# Patient Record
Sex: Female | Born: 1970 | Race: White | Hispanic: No | Marital: Married | Smoking: Former smoker
Health system: Southern US, Community
[De-identification: ages and names within clinical notes are randomized; demographics above are authoritative.]

## PROBLEM LIST (undated history)

## (undated) DIAGNOSIS — J302 Other seasonal allergic rhinitis: Secondary | ICD-10-CM

## (undated) DIAGNOSIS — K219 Gastro-esophageal reflux disease without esophagitis: Secondary | ICD-10-CM

## (undated) DIAGNOSIS — M85612 Other cyst of bone, left shoulder: Secondary | ICD-10-CM

## (undated) HISTORY — DX: Other cyst of bone, left shoulder: M85.612

## (undated) HISTORY — DX: Other seasonal allergic rhinitis: J30.2

## (undated) HISTORY — PX: TUBAL LIGATION: SHX77

---

## 2005-05-01 ENCOUNTER — Ambulatory Visit: Payer: Self-pay | Admitting: Internal Medicine

## 2010-03-30 ENCOUNTER — Ambulatory Visit: Payer: Self-pay

## 2014-12-22 ENCOUNTER — Emergency Department
Admit: 2014-12-22 | Disposition: A | Discharge status: Home / Self Care | Payer: Self-pay | Admitting: Emergency Medicine

## 2014-12-22 LAB — CBC
HCT: 41.9 % (ref 35.0–47.0)
HGB: 14.6 g/dL (ref 12.0–16.0)
MCH: 30.6 pg (ref 26.0–34.0)
MCHC: 34.7 g/dL (ref 32.0–36.0)
MCV: 88 fL (ref 80–100)
Platelet: 208 10*3/uL (ref 150–440)
RBC: 4.76 10*6/uL (ref 3.80–5.20)
RDW: 12.5 % (ref 11.5–14.5)
WBC: 6.7 10*3/uL (ref 3.6–11.0)

## 2014-12-22 LAB — URINALYSIS, COMPLETE
Bacteria: NONE SEEN
Bilirubin,UR: NEGATIVE
Glucose,UR: NEGATIVE mg/dL (ref 0–75)
KETONE: NEGATIVE
Leukocyte Esterase: NEGATIVE
Nitrite: NEGATIVE
PH: 6 (ref 4.5–8.0)
Protein: NEGATIVE
SPECIFIC GRAVITY: 1.014 (ref 1.003–1.030)

## 2014-12-22 LAB — BASIC METABOLIC PANEL
ANION GAP: 7 (ref 7–16)
BUN: 13 mg/dL
CREATININE: 1.02 mg/dL — AB
Calcium, Total: 9.2 mg/dL
Chloride: 105 mmol/L
Co2: 26 mmol/L
EGFR (Non-African Amer.): >60
GLUCOSE: 89 mg/dL
Potassium: 3.9 mmol/L
Sodium: 138 mmol/L

## 2014-12-22 LAB — TROPONIN I

## 2016-10-03 ENCOUNTER — Encounter: Payer: Self-pay | Admitting: Certified Nurse Midwife

## 2016-10-03 ENCOUNTER — Ambulatory Visit (INDEPENDENT_AMBULATORY_CARE_PROVIDER_SITE_OTHER): Payer: BLUE CROSS/BLUE SHIELD

## 2016-10-03 ENCOUNTER — Ambulatory Visit (INDEPENDENT_AMBULATORY_CARE_PROVIDER_SITE_OTHER): Payer: BLUE CROSS/BLUE SHIELD | Admitting: Certified Nurse Midwife

## 2016-10-03 VITALS — BP 135/85 | HR 70 | Ht 61.5 in | Wt 172.3 lb

## 2016-10-03 DIAGNOSIS — R102 Pelvic and perineal pain: Secondary | ICD-10-CM

## 2016-10-03 LAB — POCT URINALYSIS DIPSTICK
Bilirubin, UA: NEGATIVE
GLUCOSE UA: NEGATIVE
Ketones, UA: NEGATIVE
Leukocytes, UA: NEGATIVE
Nitrite, UA: NEGATIVE
Protein, UA: NEGATIVE
RBC UA: NEGATIVE
SPEC GRAV UA: 1.015
UROBILINOGEN UA: NEGATIVE
pH, UA: 6

## 2016-10-03 LAB — POCT URINE PREGNANCY: Preg Test, Ur: NEGATIVE

## 2016-10-03 NOTE — Patient Instructions (Signed)
Pelvic Pain, Female Pelvic pain is pain felt below the belly button and between your hips. It can be caused by many different things. It is important to get help right away. This is especially true for severe, sharp, or unusual pain that comes on suddenly.  HOME CARE  Only take medicine as told by your doctor.  Rest as told by your doctor.  Eat a healthy diet, such as fruits, vegetables, and lean meats.  Drink enough fluids to keep your pee (urine) clear or pale yellow, or as told.  Avoid sex (intercourse) if it causes pain.  Apply warm or cold packs to your lower belly (abdomen). Use the type of pack that helps the pain.  Avoid situations that cause you stress.  Keep a journal to track your pain. Write down:  When the pain started.  Where it is located.  If there are things that seem to be related to the pain, such as food or your period.  Follow up with your doctor as told. GET HELP RIGHT AWAY IF:   You have heavy bleeding from the vagina.  You have more pelvic pain.  You feel lightheaded or pass out (faint).  You have chills.  You have pain when you pee or have blood in your pee.  You cannot stop having watery poop (diarrhea).  You cannot stop throwing up (vomiting).  You have a fever or lasting symptoms for more than 3 days.  You have a fever and your symptoms suddenly get worse.  You are being physically or sexually abused.  Your medicine does not help your pain.  You have fluid (discharge) coming from your vagina that is not normal. MAKE SURE YOU:  Understand these instructions.  Will watch your condition.  Will get help if you are not doing well or get worse. This information is not intended to replace advice given to you by your health care provider. Make sure you discuss any questions you have with your health care provider. Document Released: 01/29/2008 Document Revised: 09/02/2014 Document Reviewed: 06/02/2015 Elsevier Interactive Patient  Education  2017 [employer].

## 2016-10-03 NOTE — Progress Notes (Signed)
GYN ENCOUNTER NOTE  Subjective:       Sharon Hickman is a 46 y.o. G58P2002 female is here for gynecologic evaluation of the following issues: pelvic pain.   Jaliyha reports bilateral sharp pelvic pain since Monday, right side > left side. She states, "I have not felt this type of pain since I was nine (9) months pregnant". Reports a faint, constant pain that morphed into a pinching pain (7-8/10 on the pain scale) on Tuesday evening. Today, she reports throbbing pain 2-3/10 on the pain scale. She reports going into work late on Tuesday and managing the pain with OTC Motrin.   Questions if this pain is related to ovulation.   Denies difficulty breathing or respiratory distress, chest pain, abnormal vaginal discharge, unexplained vaginal bleeding, dysuria, and leg pain or swelling.    Gynecologic History  Patient's last menstrual period was 09/10/2016 (approximate).   Contraception: tubal ligation   Last Pap: a while ago. Results were: normal  Last mammogram: 3 years ago. Results were: normal  Obstetric History OB History  Gravida Para Term Preterm AB Living  2 2 2     2   SAB TAB Ectopic Multiple Live Births          2    # Outcome Date GA Lbr Len/2nd Weight Sex Delivery Anes PTL Lv  2 Term 05/27/01 [redacted]w[redacted]d  8 lb 12.8 oz (3.992 kg)  CS-Unspec  N LIV  1 Term 01/08/96 [redacted]w[redacted]d  7 lb 2.1 oz (3.234 kg) F CS-Unspec   LIV      Past Medical History:  Diagnosis Date  . Seasonal allergies     Past Surgical History:  Procedure Laterality Date  . CESAREAN SECTION     X2  . TUBAL LIGATION      No current outpatient prescriptions on file prior to visit.   No current facility-administered medications on file prior to visit.     No Known Allergies  Social History   Social History  . Marital status: Married    Spouse name: N/A  . Number of children: N/A  . Years of education: N/A   Occupational History  . Not on file.   Social History Main Topics  . Smoking status: Light Tobacco  Smoker  . Smokeless tobacco: Never Used     Comment: when stressed  . Alcohol use No  . Drug use: No  . Sexual activity: Yes    Partners: Male     Comment: BTL   Other Topics Concern  . Not on file   Social History Narrative  . No narrative on file    Family History  Problem Relation Age of Onset  . Cancer Mother 66    SKIN CANCER  . Migraines Mother   . Thyroid disease Mother   . Diabetes Father   . Heart failure Father   . Hyperlipidemia Father   . Hypertension Father     The following portions of the patient's history were reviewed and updated as appropriate: allergies, current medications, past family history, past medical history, past social history, past surgical history and problem list.  Review of Systems  Review of Systems - Negative except as noted above History obtained from the patient  Objective:   BP 135/85   Pulse 70   Ht 5' 1.5" (1.562 m)   Wt 172 lb 4.8 oz (78.2 kg)   LMP 09/10/2016 (Approximate)   BMI 32.03 kg/m    CONSTITUTIONAL: Well-developed, well-nourished female in no acute distress.  ABDOMEN: Soft, non distended; Non tender.  No Organomegaly.  PELVIC:  External Genitalia: Normal  Vagina: Normal  Cervix: Normal  Uterus: Normal size, shape,consistency, mobile  Adnexa: Normal  Negative UPT   ULTRASOUND REPORT  Location: [facility] Date of Service: 10/04/15   Indications:Pelvic Pain Findings:  The uterus measures 8.6 x 5.1 x 7.1 cm. Echo texture is homogenous without evidence of focal masses.  The Endometrium measures 8.3 mm.  Right Ovary measures 3.9 x 2.8 x 2 cm. It is normal in appearance. Left Ovary measures 4.3 x 1.9 x 1.4 cm. It is normal appearance. Survey of the adnexa demonstrates no adnexal masses. 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 There is no free fluid in the cul de sac.  Impression: 1. 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 Urine dip, GC/Ch, and  ultrasound ordered. Reviewed US findings with patient.   2. Discussed management options including physician referral, use of pain medication and ovarian suppression.   3. RTC if symptoms worsen or fail to improve.   4. Schedule Annual Exam with Melody Shambley, CNM.    Diona Fanti, CNM

## 2016-10-03 NOTE — Progress Notes (Signed)
Patient ID: Sharon Hickman, female   DOB: 1971-04-22, 46 y.o.   MRN: [id number] Pt states pelvic pain started on Monday am, got worse and worse, went home after work, took medication for pain and was better Tuesday am then by afternoon pt started with a pinching pain. Wednesday the same and today pain more on right side-throbbing. Pt had BTL.

## 2016-11-22 ENCOUNTER — Other Ambulatory Visit: Payer: Self-pay | Admitting: Obstetrics and Gynecology

## 2016-12-11 ENCOUNTER — Encounter: Payer: BLUE CROSS/BLUE SHIELD | Admitting: Obstetrics and Gynecology

## 2016-12-20 ENCOUNTER — Telehealth: Payer: Self-pay | Admitting: Certified Nurse Midwife

## 2016-12-20 DIAGNOSIS — Z1231 Encounter for screening mammogram for malignant neoplasm of breast: Secondary | ICD-10-CM

## 2016-12-20 NOTE — Telephone Encounter (Signed)
Order placed

## 2016-12-20 NOTE — Telephone Encounter (Signed)
Patient called to schedule her mammogram and order is needed

## 2016-12-25 ENCOUNTER — Encounter: Payer: BLUE CROSS/BLUE SHIELD | Admitting: Obstetrics and Gynecology

## 2017-03-07 ENCOUNTER — Encounter: Payer: BLUE CROSS/BLUE SHIELD | Admitting: Obstetrics and Gynecology

## 2017-03-14 ENCOUNTER — Encounter: Payer: BLUE CROSS/BLUE SHIELD | Admitting: Obstetrics and Gynecology

## 2017-03-19 ENCOUNTER — Ambulatory Visit (INDEPENDENT_AMBULATORY_CARE_PROVIDER_SITE_OTHER): Payer: BLUE CROSS/BLUE SHIELD | Admitting: Obstetrics and Gynecology

## 2017-03-19 ENCOUNTER — Other Ambulatory Visit: Payer: Self-pay | Admitting: Obstetrics and Gynecology

## 2017-03-19 ENCOUNTER — Encounter: Payer: Self-pay | Admitting: Obstetrics and Gynecology

## 2017-03-19 VITALS — BP 131/85 | HR 88 | Ht 61.5 in | Wt 161.9 lb

## 2017-03-19 DIAGNOSIS — Z01419 Encounter for gynecological examination (general) (routine) without abnormal findings: Secondary | ICD-10-CM | POA: Diagnosis not present

## 2017-03-19 MED ORDER — CYANOCOBALAMIN 1000 MCG/ML IJ SOLN: 1000.0000 ug | INTRAMUSCULAR | 1 refills | Status: DC

## 2017-03-19 NOTE — Patient Instructions (Signed)
Preventive Care 18-39 Years, Female Preventive care refers to lifestyle choices and visits with your health care provider that can promote health and wellness. What does preventive care include?  A yearly physical exam. This is also called an annual well check.  Dental exams once or twice a year.  Routine eye exams. Ask your health care provider how often you should have your eyes checked.  Personal lifestyle choices, including: ? Daily care of your teeth and gums. ? Regular physical activity. ? Eating a healthy diet. ? Avoiding tobacco and drug use. ? Limiting alcohol use. ? Practicing safe sex. ? Taking vitamin and mineral supplements as recommended by your health care provider. What happens during an annual well check? The services and screenings done by your health care provider during your annual well check will depend on your age, overall health, lifestyle risk factors, and family history of disease. Counseling Your health care provider may ask you questions about your:  Alcohol use.  Tobacco use.  Drug use.  Emotional well-being.  Home and relationship well-being.  Sexual activity.  Eating habits.  Work and work Statistician.  Method of birth control.  Menstrual cycle.  Pregnancy history.  Screening You may have the following tests or measurements:  Height, weight, and BMI.  Diabetes screening. This is done by checking your blood sugar (glucose) after you have not eaten for a while (fasting).  Blood pressure.  Lipid and cholesterol levels. These may be checked every 5 years starting at age 38.  Skin check.  Hepatitis C blood test.  Hepatitis B blood test.  Sexually transmitted disease (STD) testing.  BRCA-related cancer screening. This may be done if you have a family history of breast, ovarian, tubal, or peritoneal cancers.  Pelvic exam and Pap test. This may be done every 3 years starting at age 38. Starting at age 30, this may be done  every 5 years if you have a Pap test in combination with an HPV test.  Discuss your test results, treatment options, and if necessary, the need for more tests with your health care provider. Vaccines Your health care provider may recommend certain vaccines, such as:  Influenza vaccine. This is recommended every year.  Tetanus, diphtheria, and acellular pertussis (Tdap, Td) vaccine. You may need a Td booster every 10 years.  Varicella vaccine. You may need this if you have not been vaccinated.  HPV vaccine. If you are 39 or younger, you may need three doses over 6 months.  Measles, mumps, and rubella (MMR) vaccine. You may need at least one dose of MMR. You may also need a second dose.  Pneumococcal 13-valent conjugate (PCV13) vaccine. You may need this if you have certain conditions and were not previously vaccinated.  Pneumococcal polysaccharide (PPSV23) vaccine. You may need one or two doses if you smoke cigarettes or if you have certain conditions.  Meningococcal vaccine. One dose is recommended if you are age 68-21 years and a first-year college student living in a residence hall, or if you have one of several medical conditions. You may also need additional booster doses.  Hepatitis A vaccine. You may need this if you have certain conditions or if you travel or work in places where you may be exposed to hepatitis A.  Hepatitis B vaccine. You may need this if you have certain conditions or if you travel or work in places where you may be exposed to hepatitis B.  Haemophilus influenzae type b (Hib) vaccine. You may need this  if you have certain risk factors.  Talk to your health care provider about which screenings and vaccines you need and how often you need them. This information is not intended to replace advice given to you by your health care provider. Make sure you discuss any questions you have with your health care provider. Document Released: 10/08/2001 Document Revised:  05/01/2016 Document Reviewed: 06/13/2015 Elsevier Interactive Patient Education  2017 [employer] Inc[employer]  

## 2017-03-19 NOTE — Progress Notes (Signed)
Subjective:   Sharon Hickman is a 46 y.o. G47P2002 Caucasian female here for a routine well-woman exam.  Patient's last menstrual period was 02/19/2017.    Current complaints: painful abdominal cramps for 2 weeks around menses monthly, resolved with HRT pellets placed by [facility] in [place]. Overall feels much better and has also lost 20# PCP: me       Does need routine screening labs  Social History: Sexual: heterosexual Marital Status: married Living situation: with family Occupation: unknown occupation Tobacco/alcohol: no tobacco use Illicit drugs: no history of illicit drug use  The following portions of the patient's history were reviewed and updated as appropriate: allergies, current medications, past family history, past medical history, past social history, past surgical history and problem list.  Past Medical History Past Medical History:  Diagnosis Date  . Seasonal allergies     Past Surgical History Past Surgical History:  Procedure Laterality Date  . CESAREAN SECTION     X2  . TUBAL LIGATION      Gynecologic History [id number]  Patient's last menstrual period was 02/19/2017. Contraception: tubal ligation Last Pap: ?Marland Kitchen Results were: normal Last mammogram: 2016. Results were: normal  Obstetric History OB History  Gravida Para Term Preterm AB Living  2 2 2     2   SAB TAB Ectopic Multiple Live Births          2    # Outcome Date GA Lbr Len/2nd Weight Sex Delivery Anes PTL Lv  2 Term 05/27/01 [redacted]w[redacted]d  8 lb 12.8 oz (3.992 kg)  CS-Unspec  N LIV  1 Term 01/08/96 [redacted]w[redacted]d  7 lb 2.1 oz (3.234 kg) F CS-Unspec   LIV      Current Medications Current Outpatient Prescriptions on File Prior to Visit  Medication Sig Dispense Refill  . guaiFENesin (MUCINEX) 600 MG 12 hr tablet Take by mouth 2 (two) times daily.    . phentermine (ADIPEX-P) 37.5 MG tablet Take by mouth.     No current facility-administered medications on file prior to visit.     Review of Systems Patient  denies any headaches, blurred vision, shortness of breath, chest pain, abdominal pain, problems with bowel movements, urination, or intercourse.  Objective:  BP 131/85   Pulse 88   Ht 5' 1.5" (1.562 m)   Wt 161 lb 14.4 oz (73.4 kg)   LMP 02/19/2017   BMI 30.10 kg/m  Physical Exam  General:  Well developed, well nourished, no acute distress. She is alert and oriented x3. Skin:  Warm and dry Neck:  Midline trachea, no thyromegaly or nodules Cardiovascular: Regular rate and rhythm, no murmur heard Lungs:  Effort normal, all lung fields clear to auscultation bilaterally Breasts:  No dominant palpable mass, retraction, or nipple discharge Abdomen:  Soft, non tender, no hepatosplenomegaly or masses Pelvic:  External genitalia is normal in appearance.  The vagina is normal in appearance. The cervix is bulbous, no CMT.  Thin prep pap is done with HR HPV cotesting. Uterus is felt to be normal size, shape, and contour.  No adnexal masses or tenderness noted  Extremities:  No swelling or varicosities noted Psych:  She has a normal mood and affect  Assessment:   Healthy well-woman exam HRT   Plan:  B12 prescription sent in-will have coworker give them to her. F/U 1 year for AE, or sooner if needed Mammogram ordered  Alazia Crocket Rockney Ghee, CNM

## 2017-03-20 ENCOUNTER — Other Ambulatory Visit: Payer: Self-pay | Admitting: Obstetrics and Gynecology

## 2017-03-20 DIAGNOSIS — M85612 Other cyst of bone, left shoulder: Secondary | ICD-10-CM

## 2017-03-20 HISTORY — DX: Other cyst of bone, left shoulder: M85.612

## 2017-03-21 LAB — CYTOLOGY - PAP

## 2017-03-25 ENCOUNTER — Telehealth: Payer: Self-pay | Admitting: *Deleted

## 2017-03-25 NOTE — Telephone Encounter (Signed)
-----   Message from Joylene Igo, [place] sent at 03/25/2017  1:16 PM EDT ----- Pap and HPV are both negative

## 2017-03-25 NOTE — Telephone Encounter (Signed)
Mailed labs to pt

## 2017-04-02 ENCOUNTER — Encounter: Payer: Self-pay | Admitting: General Surgery

## 2017-04-02 ENCOUNTER — Ambulatory Visit (INDEPENDENT_AMBULATORY_CARE_PROVIDER_SITE_OTHER): Payer: BLUE CROSS/BLUE SHIELD | Admitting: General Surgery

## 2017-04-02 VITALS — BP 144/91 | HR 77 | Temp 98.1°F | Ht 61.5 in | Wt 160.0 lb

## 2017-04-02 DIAGNOSIS — D171 Benign lipomatous neoplasm of skin and subcutaneous tissue of trunk: Secondary | ICD-10-CM

## 2017-04-03 DIAGNOSIS — D171 Benign lipomatous neoplasm of skin and subcutaneous tissue of trunk: Secondary | ICD-10-CM | POA: Insufficient documentation

## 2017-04-03 NOTE — Patient Instructions (Signed)
Please look at your blue sheet in case you have questions about your surgery on 04/17/2017 at [facility].

## 2017-04-03 NOTE — Progress Notes (Signed)
Patient ID: Sharon Hickman, female   DOB: 08/02/1971, 45 y.o.   MRN: [id number]  CC: Soft tissue mass of the left clavicle  HPI Sharon Hickman is a 45 y.o. female presents to clinic today for evaluation of a soft tissue mass on the medial aspect of the left clavicle. Patient reports is been there for many years and has gradually increased in size over the last several years. It is not painful. It has never been red or infected and has never had drainage. It is always been soft and freely mobile. Given its visibility and proximity to her bra strap it does sometimes become irritated causing herdiscomfort. She denies any fevers, chills, nausea, vomiting, chest pain, shortness breath, diarrhea, constipation. She is otherwise in her usual state of health.  HPI  Past Medical History:  Diagnosis Date  . Cyst of left clavicle 03/20/2017  . Seasonal allergies     Past Surgical History:  Procedure Laterality Date  . CESAREAN SECTION     X2  . TUBAL LIGATION      Family History  Problem Relation Age of Onset  . Cancer Mother 73       SKIN CANCER  . Migraines Mother   . Thyroid disease Mother   . Diabetes Father   . Heart failure Father   . Hyperlipidemia Father   . Hypertension Father     Social History Social History  Substance Use Topics  . Smoking status: Light Tobacco Smoker  . Smokeless tobacco: Never Used     Comment: when stressed  . Alcohol use 0.6 oz/week    1 Glasses of wine per week     Comment: rarely    No Known Allergies  Current Outpatient Prescriptions  Medication Sig Dispense Refill  . cyanocobalamin (,VITAMIN B-12,) 1000 MCG/ML injection Inject 1 mL (1,000 mcg total) into the muscle every 30 (thirty) days. 10 mL 1   No current facility-administered medications for this visit.      Review of Systems A Multi-point review of systems was asked and was negative except for the findings documented in the history of present illness  Physical Exam Blood pressure (!)  144/91, pulse 77, temperature 98.1 F (36.7 C), temperature source Oral, height 5' 1.5" (1.562 m), weight 72.6 kg (160 lb). CONSTITUTIONAL: No acute distress. EYES: Pupils are equal, round, and reactive to light, Sclera are non-icteric. EARS, NOSE, MOUTH AND THROAT: The oropharynx is clear. The oral mucosa is pink and moist. Hearing is intact to voice. LYMPH NODES:  Lymph nodes in the neck are normal. RESPIRATORY:  Lungs are clear. There is normal respiratory effort, with equal breath sounds bilaterally, and without pathologic use of accessory muscles. CARDIOVASCULAR: Heart is regular without murmurs, gallops, or rubs. GI: The abdomen is soft, nontender, and nondistended. There are no palpable masses. There is no hepatosplenomegaly. There are normal bowel sounds in all quadrants. GU: Rectal deferred.   MUSCULOSKELETAL: Normal muscle strength and tone. No cyanosis or edema.   SKIN: Turgor is good. A 4 cm diameter soft tissue mass is present on the medial aspect of the left clavicle. It is soft and freely mobile consistent with a lipoma. NEUROLOGIC: Motor and sensation is grossly normal. Cranial nerves are grossly intact. PSYCH:  Oriented to person, place and time. Affect is normal.  Data Reviewed No images labs to review for this. I have personally reviewed the patient's imaging, laboratory findings and medical records.    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    Plan    45-year-old female with a soft tissue mass to the left clavicle that is been increasing in size. Discussed the likelihood of this being something malignant is very low however given its increasing size and discomfort is causing discussed that if resection is warranted. Discussed the procedure for local resection in the operating room in detail to include the risks, benefits, alternatives. Patient voiced understanding and desires to proceed. We will plan to proceed to surgery on  Thursday, August 23.     Time spent with the patient was 30 minutes, with more than 50% of the time spent in face-to-face education, counseling and care coordination.     Orlan Aversa, MD FACS General Surgeon 04/03/2017, 7:43 AM    

## 2017-04-04 ENCOUNTER — Telehealth: Payer: Self-pay | Admitting: General Surgery

## 2017-04-04 NOTE — Telephone Encounter (Signed)
I have called patient and left a message. Please advise patient of the information below.   Pt advised of pre op date/time and sx date. Sx: 04/17/17 with Dr Dionne Bucy biopsy of soft tissue mass-left clavicle. Pre op: 04/10/17 between 9-1:00--phone  Patient made aware to call [phone], between 1-3:00pm the day before surgery, to find out what time to arrive.     Patient is responsible for 231.39 for physician charges.

## 2017-04-07 NOTE — Telephone Encounter (Signed)
I have contacted patient and surgery information was given. Patient will pay the physician estimate prior to surgery. No other questions.

## 2017-04-10 ENCOUNTER — Encounter
Admission: RE | Admit: 2017-04-10 | Discharge: 2017-04-10 | Disposition: A | Discharge status: Home / Self Care | Payer: BLUE CROSS/BLUE SHIELD | Source: Ambulatory Visit | Attending: General Surgery | Admitting: General Surgery

## 2017-04-10 HISTORY — DX: Gastro-esophageal reflux disease without esophagitis: K21.9

## 2017-04-10 NOTE — Patient Instructions (Signed)
  Your procedure is scheduled on: 04-17-17 Report to Same Day Surgery 2nd floor medical mall [facility] Entrance-take elevator on left to 2nd floor.  Check in with surgery information desk.) To find out your arrival time please call [phone] between 1PM - 3PM on 04-16-17  Remember: Instructions that are not followed completely may result in serious medical risk, up to and including death, or upon the discretion of your surgeon and anesthesiologist your surgery may need to be rescheduled.    _x___ 1. Do not eat food or drink liquids after midnight. No gum chewing or hard candies.     __x__ 2. No Alcohol for 24 hours before or after surgery.   __x__3. No Smoking for 24 prior to surgery.   ____  4. Bring all medications with you on the day of surgery if instructed.    __x__ 5. Notify your doctor if there is any change in your medical condition     (cold, fever, infections).     Do not wear jewelry, make-up, hairpins, clips or nail polish.  Do not wear lotions, powders, or perfumes. You may wear deodorant.  Do not shave 48 hours prior to surgery. Men may shave face and neck.  Do not bring valuables to the hospital.    [facility] is not responsible for any belongings or valuables.               Contacts, dentures or bridgework may not be worn into surgery.  Leave your suitcase in the car. After surgery it may be brought to your room.  For patients admitted to the hospital, discharge time is determined by your  treatment team.   Patients discharged the day of surgery will not be allowed to drive home.  You will need someone to drive you home and stay with you the night of your procedure.    Please read over the following fact sheets that you were given:     _x___ Take anti-hypertensive (unless it includes a diuretic), cardiac, seizure, asthma,     anti-reflux and psychiatric medicines. These include:  1. PRILOSEC  2. TAKE A PRILOSEC Wednesday NIGHT BEFORE  SURGERY  3.  4.  5.  6.  ____Fleets enema or Magnesium Citrate as directed.   ____ Use CHG Soap or sage wipes as directed on instruction sheet   ____ Use inhalers on the day of surgery and bring to hospital day of surgery  ____ Stop Metformin and Janumet 2 days prior to surgery.    ____ Take 1/2 of usual insulin dose the night before surgery and none on the morning     surgery.   ____ Follow recommendations from Cardiologist, Pulmonologist or PCP regarding stopping Aspirin, Coumadin, Pllavix ,Eliquis, Effient, or Pradaxa, and Pletal.  X____Stop Anti-inflammatories such as Advil, Aleve, Ibuprofen, Motrin, Naproxen, Naprosyn, Goodies powders or aspirin products NOW-OK to take Tylenol .   ____ Stop supplements until after surgery.     ____ Bring C-Pap to the hospital.

## 2017-04-16 MED ORDER — CEFAZOLIN SODIUM-DEXTROSE 2-4 GM/100ML-% IV SOLN
2.0000 g | INTRAVENOUS | Status: AC
  Administered 2017-04-17: 2 g via INTRAVENOUS

## 2017-04-17 ENCOUNTER — Encounter: Payer: Self-pay | Admitting: Anesthesiology

## 2017-04-17 ENCOUNTER — Ambulatory Visit: Payer: BLUE CROSS/BLUE SHIELD | Admitting: Anesthesiology

## 2017-04-17 ENCOUNTER — Encounter
Admission: RE | Disposition: A | Discharge status: Home / Self Care | Payer: Self-pay | Source: Ambulatory Visit | Attending: General Surgery

## 2017-04-17 ENCOUNTER — Ambulatory Visit
Admission: RE | Admit: 2017-04-17 | Discharge: 2017-04-17 | Disposition: A | Discharge status: Home / Self Care | Payer: BLUE CROSS/BLUE SHIELD | Source: Ambulatory Visit | Attending: General Surgery | Admitting: General Surgery

## 2017-04-17 DIAGNOSIS — Z8349 Family history of other endocrine, nutritional and metabolic diseases: Secondary | ICD-10-CM | POA: Insufficient documentation

## 2017-04-17 DIAGNOSIS — D1722 Benign lipomatous neoplasm of skin and subcutaneous tissue of left arm: Secondary | ICD-10-CM

## 2017-04-17 DIAGNOSIS — F17200 Nicotine dependence, unspecified, uncomplicated: Secondary | ICD-10-CM | POA: Insufficient documentation

## 2017-04-17 DIAGNOSIS — Z820 Family history of epilepsy and other diseases of the nervous system: Secondary | ICD-10-CM | POA: Insufficient documentation

## 2017-04-17 DIAGNOSIS — D171 Benign lipomatous neoplasm of skin and subcutaneous tissue of trunk: Secondary | ICD-10-CM | POA: Diagnosis not present

## 2017-04-17 DIAGNOSIS — Z8249 Family history of ischemic heart disease and other diseases of the circulatory system: Secondary | ICD-10-CM | POA: Insufficient documentation

## 2017-04-17 DIAGNOSIS — Z9889 Other specified postprocedural states: Secondary | ICD-10-CM | POA: Diagnosis not present

## 2017-04-17 DIAGNOSIS — Z808 Family history of malignant neoplasm of other organs or systems: Secondary | ICD-10-CM | POA: Insufficient documentation

## 2017-04-17 DIAGNOSIS — Z833 Family history of diabetes mellitus: Secondary | ICD-10-CM | POA: Diagnosis not present

## 2017-04-17 DIAGNOSIS — Z79899 Other long term (current) drug therapy: Secondary | ICD-10-CM | POA: Diagnosis not present

## 2017-04-17 HISTORY — PX: LIPOMA EXCISION: SHX5283

## 2017-04-17 LAB — POCT PREGNANCY, URINE: Preg Test, Ur: NEGATIVE

## 2017-04-17 SURGERY — EXCISION LIPOMA
Anesthesia: General | Site: Chest | Laterality: Left | Wound class: Clean

## 2017-04-17 MED ORDER — DEXAMETHASONE SODIUM PHOSPHATE 10 MG/ML IJ SOLN
INTRAMUSCULAR | Status: AC
Start: 2017-04-17 — End: 2017-04-17
  Filled 2017-04-17: qty 1

## 2017-04-17 MED ORDER — CHLORHEXIDINE GLUCONATE CLOTH 2 % EX PADS: 6.0000 | MEDICATED_PAD | Freq: Once | CUTANEOUS | Status: DC

## 2017-04-17 MED ORDER — GLYCOPYRROLATE 0.2 MG/ML IJ SOLN
INTRAMUSCULAR | Status: DC | PRN
Start: 2017-04-17 — End: 2017-04-17
  Administered 2017-04-17: 0.2 mg via INTRAVENOUS

## 2017-04-17 MED ORDER — FENTANYL CITRATE (PF) 100 MCG/2ML IJ SOLN
INTRAMUSCULAR | Status: DC | PRN
  Administered 2017-04-17: 50 ug via INTRAVENOUS

## 2017-04-17 MED ORDER — PROPOFOL 10 MG/ML IV BOLUS
INTRAVENOUS | Status: AC
  Filled 2017-04-17: qty 20

## 2017-04-17 MED ORDER — BUPIVACAINE HCL (PF) 0.5 % IJ SOLN
INTRAMUSCULAR | Status: DC | PRN
  Administered 2017-04-17: 4 mL

## 2017-04-17 MED ORDER — MIDAZOLAM HCL 2 MG/2ML IJ SOLN
INTRAMUSCULAR | Status: AC
  Filled 2017-04-17: qty 2

## 2017-04-17 MED ORDER — ONDANSETRON HCL 4 MG/2ML IJ SOLN
INTRAMUSCULAR | Status: AC
  Filled 2017-04-17: qty 2

## 2017-04-17 MED ORDER — LIDOCAINE HCL (PF) 1 % IJ SOLN
INTRAMUSCULAR | Status: DC | PRN
  Administered 2017-04-17: 4 mL

## 2017-04-17 MED ORDER — ONDANSETRON HCL 4 MG/2ML IJ SOLN
INTRAMUSCULAR | Status: DC | PRN
  Administered 2017-04-17: 4 mg via INTRAVENOUS

## 2017-04-17 MED ORDER — FENTANYL CITRATE (PF) 100 MCG/2ML IJ SOLN
INTRAMUSCULAR | Status: AC
  Filled 2017-04-17: qty 2

## 2017-04-17 MED ORDER — MIDAZOLAM HCL 2 MG/2ML IJ SOLN
INTRAMUSCULAR | Status: DC | PRN
  Administered 2017-04-17: 2 mg via INTRAVENOUS

## 2017-04-17 MED ORDER — LIDOCAINE HCL (PF) 2 % IJ SOLN
INTRAMUSCULAR | Status: AC
  Filled 2017-04-17: qty 2

## 2017-04-17 MED ORDER — ONDANSETRON HCL 4 MG/2ML IJ SOLN: 4.0000 mg | Freq: Once | INTRAMUSCULAR | Status: DC | PRN

## 2017-04-17 MED ORDER — FENTANYL CITRATE (PF) 100 MCG/2ML IJ SOLN: 25.0000 ug | INTRAMUSCULAR | Status: DC | PRN

## 2017-04-17 MED ORDER — LIDOCAINE HCL (CARDIAC) 20 MG/ML IV SOLN
INTRAVENOUS | Status: DC | PRN
  Administered 2017-04-17: 80 mg via INTRAVENOUS

## 2017-04-17 MED ORDER — DEXAMETHASONE SODIUM PHOSPHATE 10 MG/ML IJ SOLN
INTRAMUSCULAR | Status: DC | PRN
  Administered 2017-04-17: 4 mg via INTRAVENOUS

## 2017-04-17 MED ORDER — PROPOFOL 10 MG/ML IV BOLUS
INTRAVENOUS | Status: DC | PRN
  Administered 2017-04-17: 150 mg via INTRAVENOUS

## 2017-04-17 MED ORDER — LACTATED RINGERS IV SOLN
INTRAVENOUS | Status: DC
  Administered 2017-04-17: 50 mL/h via INTRAVENOUS

## 2017-04-17 SURGICAL SUPPLY — 31 items
BLADE SURG 15 STRL LF DISP TIS (BLADE) ×1 IMPLANT
BLADE SURG 15 STRL SS (BLADE) ×2
CHLORAPREP W/TINT 26ML (MISCELLANEOUS) ×3 IMPLANT
DERMABOND ADVANCED (GAUZE/BANDAGES/DRESSINGS) ×2
DERMABOND ADVANCED .7 DNX12 (GAUZE/BANDAGES/DRESSINGS) ×1 IMPLANT
DRAPE LAPAROTOMY 100X77 ABD (DRAPES) ×3 IMPLANT
ELECT CAUTERY NEEDLE TIP 1.0 (MISCELLANEOUS) ×3
ELECT REM PT RETURN 9FT ADLT (ELECTROSURGICAL) ×3
ELECTRODE CAUTERY NEDL TIP 1.0 (MISCELLANEOUS) ×1 IMPLANT
ELECTRODE REM PT RTRN 9FT ADLT (ELECTROSURGICAL) ×1 IMPLANT
GLOVE BIO SURGEON STRL SZ7.5 (GLOVE) ×3 IMPLANT
GLOVE INDICATOR 8.0 STRL GRN (GLOVE) ×3 IMPLANT
GOWN STRL REUS W/ TWL LRG LVL3 (GOWN DISPOSABLE) ×2 IMPLANT
GOWN STRL REUS W/TWL LRG LVL3 (GOWN DISPOSABLE) ×4
JACKSON PRATT 7MM (INSTRUMENTS) IMPLANT
KIT RM TURNOVER STRD PROC AR (KITS) ×3 IMPLANT
LABEL OR SOLS (LABEL) IMPLANT
NDL SAFETY 25GX1.5 (NEEDLE) ×3 IMPLANT
NS IRRIG 500ML POUR BTL (IV SOLUTION) ×3 IMPLANT
PACK BASIN MINOR ARMC (MISCELLANEOUS) ×3 IMPLANT
SPONGE DRAIN TRACH 4X4 STRL 2S (GAUZE/BANDAGES/DRESSINGS) IMPLANT
SUT ETHILON 3-0 FS-10 30 BLK (SUTURE)
SUT MNCRL 4-0 (SUTURE) ×2
SUT MNCRL 4-0 27XMFL (SUTURE) ×1
SUT VIC AB 2-0 CT2 27 (SUTURE) IMPLANT
SUT VIC AB 3-0 SH 27 (SUTURE) ×2
SUT VIC AB 3-0 SH 27X BRD (SUTURE) ×1 IMPLANT
SUTURE EHLN 3-0 FS-10 30 BLK (SUTURE) IMPLANT
SUTURE MNCRL 4-0 27XMF (SUTURE) ×1 IMPLANT
SYR BULB EAR ULCER 3OZ GRN STR (SYRINGE) ×3 IMPLANT
SYRINGE 10CC LL (SYRINGE) ×3 IMPLANT

## 2017-04-17 NOTE — OR Nursing (Signed)
Patient complains of runny nose for last few days.  Denies fever, chills or any other symptoms.

## 2017-04-17 NOTE — Transfer of Care (Signed)
Immediate Anesthesia Transfer of Care Note  Patient: Sharon Hickman  Procedure(s) Performed: Procedure(s): EXCISION LIPOMA-LEFT CLAVICLE (Left)  Patient Location: PACU  Anesthesia Type:General  Level of Consciousness: sedated  Airway & Oxygen Therapy: Patient Spontanous Breathing and Patient connected to face mask oxygen  Post-op Assessment: Report given to RN and Post -op Vital signs reviewed and stable  Post vital signs: Reviewed and stable  Last Vitals:  Vitals:   04/17/17 1025 04/17/17 1026  BP: (!) (P) 91/58 (!) 91/58  Pulse:  (!) 44  Resp:  13  Temp: (!) (P) 36.2 C   SpO2:  94%    Last Pain:  Vitals:   04/17/17 1025  TempSrc:   PainSc: (P) Asleep      Patients Stated Pain Goal: 0 (09/31/12 1624)  Complications: No apparent anesthesia complications

## 2017-04-17 NOTE — H&P (View-Only) (Signed)
Patient ID: Sharon Hickman, female   DOB: [birth year], 46 y.o.   MRN: [id number]  CC: Soft tissue mass of the left clavicle  HPI Sharon Hickman is a 46 y.o. female presents to clinic today for evaluation of a soft tissue mass on the medial aspect of the left clavicle. Patient reports is been there for many years and has gradually increased in size over the last several years. It is not painful. It has never been red or infected and has never had drainage. It is always been soft and freely mobile. Given its visibility and proximity to her bra strap it does sometimes become irritated causing herdiscomfort. She denies any fevers, chills, nausea, vomiting, chest pain, shortness breath, diarrhea, constipation. She is otherwise in her usual state of health.  HPI  Past Medical History:  Diagnosis Date  . Cyst of left clavicle 03/20/2017  . Seasonal allergies     Past Surgical History:  Procedure Laterality Date  . CESAREAN SECTION     X2  . TUBAL LIGATION      Family History  Problem Relation Age of Onset  . Cancer Mother 52       SKIN CANCER  . Migraines Mother   . Thyroid disease Mother   . Diabetes Father   . Heart failure Father   . Hyperlipidemia Father   . Hypertension Father     Social History Social History  Substance Use Topics  . Smoking status: Light Tobacco Smoker  . Smokeless tobacco: Never Used     Comment: when stressed  . Alcohol use 0.6 oz/week    1 Glasses of wine per week     Comment: rarely    No Known Allergies  Current Outpatient Prescriptions  Medication Sig Dispense Refill  . cyanocobalamin (,VITAMIN B-12,) 1000 MCG/ML injection Inject 1 mL (1,000 mcg total) into the muscle every 30 (thirty) days. 10 mL 1   No current facility-administered medications for this visit.      Review of Systems A Multi-point review of systems was asked and was negative except for the findings documented in the history of present illness  Physical Exam Blood pressure (!)  144/91, pulse 77, temperature 98.1 F (36.7 C), temperature source Oral, height 5' 1.5" (1.562 m), weight 72.6 kg (160 lb). CONSTITUTIONAL: No acute distress. EYES: Pupils are equal, round, and reactive to light, Sclera are non-icteric. EARS, NOSE, MOUTH AND THROAT: The oropharynx is clear. The oral mucosa is pink and moist. Hearing is intact to voice. LYMPH NODES:  Lymph nodes in the neck are normal. RESPIRATORY:  Lungs are clear. There is normal respiratory effort, with equal breath sounds bilaterally, and without pathologic use of accessory muscles. CARDIOVASCULAR: Heart is regular without murmurs, gallops, or rubs. GI: The abdomen is soft, nontender, and nondistended. There are no palpable masses. There is no hepatosplenomegaly. There are normal bowel sounds in all quadrants. GU: Rectal deferred.   MUSCULOSKELETAL: Normal muscle strength and tone. No cyanosis or edema.   SKIN: Turgor is good. A 4 cm diameter soft tissue mass is present on the medial aspect of the left clavicle. It is soft and freely mobile consistent with a lipoma. NEUROLOGIC: Motor and sensation is grossly normal. Cranial nerves are grossly intact. PSYCH:  Oriented to person, place and time. Affect is normal.  Data Reviewed No images labs to review for this. I have personally reviewed the patient's imaging, laboratory findings and medical records.    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    Plan    46 year old female with a soft tissue mass to the left clavicle that is been increasing in size. Discussed the likelihood of this being something malignant is very low however given its increasing size and discomfort is causing discussed that if resection is warranted. Discussed the procedure for local resection in the operating room in detail to include the risks, benefits, alternatives. Patient voiced understanding and desires to proceed. We will plan to proceed to surgery on  Thursday, August 23.     Time spent with the patient was 30 minutes, with more than 50% of the time spent in face-to-face education, counseling and care coordination.     Clayburn Pert, MD FACS General Surgeon 04/03/2017, 7:43 AM

## 2017-04-17 NOTE — Interval H&P Note (Signed)
History and Physical Interval Note:  04/17/2017 9:29 AM  Sharon Hickman  has presented today for surgery, with the diagnosis of lipoma of chest wall -left  The various methods of treatment have been discussed with the patient and family. After consideration of risks, benefits and other options for treatment, the patient has consented to  Procedure(s): EXCISION LIPOMA-LEFT CLAVICLE (Left) as a surgical intervention .  The patient's history has been reviewed, patient examined, no change in status, stable for surgery.  I have reviewed the patient's chart and labs.  Questions were answered to the patient's satisfaction.     Clayburn Pert

## 2017-04-17 NOTE — Brief Op Note (Signed)
04/17/2017  10:22 AM  PATIENT:  Sharon Hickman  46 y.o. female  PRE-OPERATIVE DIAGNOSIS:  lipoma of chest wall -left  POST-OPERATIVE DIAGNOSIS:  lipoma of chest wall -left  PROCEDURE:  Procedure(s): EXCISION LIPOMA-LEFT CLAVICLE (Left)  SURGEON:  Surgeon(s) and Role:    Clayburn Pert, MD - Primary  PHYSICIAN ASSISTANT:   ASSISTANTS: none   ANESTHESIA:   general  EBL:  No intake/output data recorded.  BLOOD ADMINISTERED:none  DRAINS: none   LOCAL MEDICATIONS USED:  MARCAINE   , XYLOCAINE  and Amount: 8 ml  SPECIMEN:  Source of Specimen:  Left shoulder lipoma  DISPOSITION OF SPECIMEN:  PATHOLOGY  COUNTS:  YES  TOURNIQUET:  * No tourniquets in log *  DICTATION: .Dragon Dictation  PLAN OF CARE: Discharge to home after PACU  PATIENT DISPOSITION:  PACU - hemodynamically stable.   Delay start of Pharmacological VTE agent (>24hrs) due to surgical blood loss or risk of bleeding: not applicable

## 2017-04-17 NOTE — OR Nursing (Signed)
Dr. Adonis Huguenin in to see pt [phone] am

## 2017-04-17 NOTE — Anesthesia Postprocedure Evaluation (Signed)
Anesthesia Post Note  Patient: Sharon Hickman  Procedure(s) Performed: Procedure(s) (LRB): EXCISION LIPOMA-LEFT CLAVICLE (Left)  Patient location during evaluation: PACU Anesthesia Type: General Level of consciousness: awake and alert Pain management: pain level controlled Vital Signs Assessment: post-procedure vital signs reviewed and stable Respiratory status: spontaneous breathing, nonlabored ventilation, respiratory function stable and patient connected to nasal cannula oxygen Cardiovascular status: blood pressure returned to baseline and stable Postop Assessment: no signs of nausea or vomiting Anesthetic complications: no     Last Vitals:  Vitals:   04/17/17 1059 04/17/17 1109  BP:  127/73  Pulse: 75 (!) 51  Resp: 17 16  Temp: (!) 36.3 C 37.1 C  SpO2: 97% 98%    Last Pain:  Vitals:   04/17/17 1109  TempSrc: Temporal  PainSc:                  [name] S

## 2017-04-17 NOTE — Op Note (Addendum)
   Pre-operative Diagnosis: Left supraclavicular lipoma  Post-operative Diagnosis: Same  Procedure performed: Excisional biopsy of left supraclavicular lipoma  Surgeon: Clayburn Pert   Assistants: None  Anesthesia: General LMA anesthesia  ASA Class: 1  Surgeon: Clayburn Pert, MD FACS  Anesthesia: Gen. with endotracheal tube  Assistant: None  Procedure Details  The patient was seen again in the Holding Room. The benefits, complications, treatment options, and expected outcomes were discussed with the patient. The risks of bleeding, infection, recurrence of symptoms, failure to resolve symptoms,  nerve or vascular injury, any of which could require further surgery were reviewed with the patient.   The patient was taken to Operating Room, identified as Sharon Hickman and the procedure verified.  A Time Out was held and the above information confirmed.  Prior to the induction of general anesthesia, antibiotic prophylaxis was administered. VTE prophylaxis was in place. General endotracheal anesthesia was then administered and tolerated well. After the induction, the left shoulder and neck was prepped with Chloraprep and draped in the sterile fashion. The patient was positioned in the supine position.  The procedure began with the localization with a 50% mixture of 1% lidocaine 0.5% Marcaine plain of the visible and palpable soft tissue mass. The skin was then incised with a 15 blade scalpel. Using Bovie much cautery the dissection was taken down to the soft tissue mass which was a lipoma. The lipoma was circumferentially freed from its loose attachments and was able to easily be delivered into our incisional site. The entire lipoma was approximately 3 cm in greatest diameter. It was removed in toto and placed off the field for pathology.  The field was copiously irrigated with normal saline. Meticulous hemostasis was insured with electrocautery. The incision was then closed in layers. A  deep interrupted 3-0 Vicryl suture followed by a running subcuticular 4-0 Monocryl suture. With the incision closed with dressed with Dermabond. The patient tolerated the procedure well. She was awoken from general anesthesia and transferred to the PACU in good condition. All counts were correct at the end of the procedure and there were no immediate complications.  Findings: Left shoulder lipoma   Estimated Blood Loss: 2 mL         Drains: None         Specimens: Lipoma          Complications: None                  Condition: Good   Clayburn Pert, MD, FACS

## 2017-04-17 NOTE — Discharge Instructions (Addendum)
Lipoma Removal, Care After Refer to this sheet in the next few weeks. These instructions provide you with information about caring for yourself after your procedure. Your health care provider may also give you more specific instructions. Your treatment has been planned according to current medical practices, but problems sometimes occur. Call your health care provider if you have any problems or questions after your procedure. What can I expect after the procedure? After the procedure, it is common to have:  Mild pain.  Swelling.  Bruising.  Follow these instructions at home:  Bathing  Do not take baths, swim, or use a hot tub until your health care provider approves. Ask your health care provider if you can take showers. You may only be allowed to take sponge baths for bathing. (Okay to shower in 24 hours, no submerging until cleared by Dr. Adonis Hickman.)  Keep your bandage (dressing) dry until your health care provider says it can be removed. Incision care   Follow instructions from your health care provider about how to take care of your incision. Make sure you: ? Wash your hands with soap and water before you change your bandage (dressing). If soap and water are not available, use hand sanitizer. ? Change your dressing as told by your health care provider. ? Leave stitches (sutures), skin glue, or adhesive strips in place. These skin closures may need to stay in place for 2 weeks or longer. If adhesive strip edges start to loosen and curl up, you may trim the loose edges. Do not remove adhesive strips completely unless your health care provider tells you to do that.  Check your incision area every day for signs of infection. Check for: ? More redness, swelling, or pain. ? Fluid or blood. ? Warmth. ? Pus or a bad smell. Driving  Do not drive or operate heavy machinery while taking prescription pain medicine.  Do not drive for 24 hours if you received a medicine to help you relax  (sedative) during your procedure.  Ask your health care provider when it is safe for you to drive. General instructions  Take over-the-counter and prescription medicines only as told by your health care provider.  Do not use any tobacco products, such as cigarettes, chewing tobacco, and e-cigarettes. These can delay healing. If you need help quitting, ask your health care provider.  Return to your normal activities as told by your health care provider. Ask your health care provider what activities are safe for you.  Keep all follow-up visits as told by your health care provider. This is important. Contact a health care provider if:  You have more redness, swelling, or pain around your incision.  You have fluid or blood coming from your incision.  Your incision feels warm to the touch.  You have pus or a bad smell coming from your incision.  You have pain that does not get better with medicine. Get help right away if:  You have chills or a fever.  You have severe pain. This information is not intended to replace advice given to you by your health care provider. Make sure you discuss any questions you have with your health care provider. Document Released: 10/26/2015 Document Revised: 01/23/2016 Document Reviewed: 10/26/2015 Elsevier Interactive Patient Education  2018 [place]   1) The drugs that you were given will stay in your system until tomorrow so for the next 24 hours you should not:  A) Drive an automobile B)  Make any legal decisions C) Drink any alcoholic beverage   2) You may resume regular meals tomorrow.  Today it is better to start with liquids and gradually work up to solid foods.  You may eat anything you prefer, but it is better to start with liquids, then soup and crackers, and gradually work up to solid foods.   3) Please notify your doctor immediately if you have any unusual bleeding, trouble  breathing, redness and pain at the surgery site, drainage, fever, or pain not relieved by medication.    4) Additional Instructions:        Please contact your physician with any problems or Same Day Surgery at [phone], Monday through Friday 6 am to 4 pm, or [place] at [facility] number at [phone].

## 2017-04-17 NOTE — Anesthesia Preprocedure Evaluation (Signed)
Anesthesia Evaluation  Patient identified by MRN, date of birth, ID band Patient awake    Reviewed: Allergy & Precautions, NPO status , Patient's Chart, lab work & pertinent test results, reviewed documented beta blocker date and time   Airway Mallampati: II  TM Distance: >3 FB     Dental  (+) Chipped   Pulmonary Current Smoker,           Cardiovascular      Neuro/Psych    GI/Hepatic   Endo/Other    Renal/GU      Musculoskeletal   Abdominal   Peds  Hematology   Anesthesia Other Findings   Reproductive/Obstetrics                             Anesthesia Physical Anesthesia Plan  ASA: III  Anesthesia Plan: General   Post-op Pain Management:    Induction: Intravenous  PONV Risk Score and Plan:   Airway Management Planned: Oral ETT and LMA  Additional Equipment:   Intra-op Plan:   Post-operative Plan:   Informed Consent: I have reviewed the patients History and Physical, chart, labs and discussed the procedure including the risks, benefits and alternatives for the proposed anesthesia with the patient or authorized representative who has indicated his/her understanding and acceptance.     Plan Discussed with: CRNA  Anesthesia Plan Comments:         Anesthesia Quick Evaluation

## 2017-04-17 NOTE — Anesthesia Post-op Follow-up Note (Signed)
Anesthesia QCDR form completed.        

## 2017-04-17 NOTE — Anesthesia Procedure Notes (Signed)
Procedure Name: LMA Insertion Performed by: Nathaniel Wakeley Pre-anesthesia Checklist: Patient identified, Patient being monitored, Timeout performed, Emergency Drugs available and Suction available Patient Re-evaluated:Patient Re-evaluated prior to induction Oxygen Delivery Method: Circle system utilized Preoxygenation: Pre-oxygenation with 100% oxygen Induction Type: IV induction LMA: LMA inserted LMA Size: 3.5 Tube type: Oral Number of attempts: 1 Placement Confirmation: positive ETCO2 and breath sounds checked- equal and bilateral Tube secured with: Tape Dental Injury: Teeth and Oropharynx as per pre-operative assessment        

## 2017-04-18 LAB — SURGICAL PATHOLOGY

## 2017-04-22 ENCOUNTER — Telehealth: Payer: Self-pay

## 2017-04-22 NOTE — Telephone Encounter (Signed)
Post-op call made to patient at this time. Spoke with Patient. Post-op interview questions below.  1. How are you feeling? Great.   2. Is your pain controlled? Yes  3. What are you doing for the pain? Nothing needed.  4. Are you having any Nausea or Vomiting? No  5. Are you having any Fever or Chills? No  6. Are you having any Constipation or Diarrhea? No  7. Is there any Swelling or Bruising you are concerned about? None  8. Do you have any questions or concerns at this time? No   Discussion: Reviewed post-op appointment with patient and asked for her to call with any questions prior to her appointment.

## 2017-04-24 ENCOUNTER — Encounter: Payer: Self-pay | Admitting: General Surgery

## 2017-04-24 ENCOUNTER — Ambulatory Visit (INDEPENDENT_AMBULATORY_CARE_PROVIDER_SITE_OTHER): Payer: BLUE CROSS/BLUE SHIELD | Admitting: General Surgery

## 2017-04-24 VITALS — BP 122/75 | HR 86 | Temp 98.5°F | Ht 61.5 in | Wt 161.2 lb

## 2017-04-24 DIAGNOSIS — Z4889 Encounter for other specified surgical aftercare: Secondary | ICD-10-CM

## 2017-04-24 NOTE — Progress Notes (Signed)
Outpatient Surgical Follow Up  04/24/2017  Sharon Hickman is an 46 y.o. female.   Chief Complaint  Patient presents with  . Routine Post Op    Excisional biopsy of left supraclavicular lipoma 04/17/17 Dr. Adonis Huguenin    HPI: 46 year old female returns to clinic now 1 week status post excisional biopsy of a left supraclavicular lipoma. Patient reports doing well. Having some soreness of the area but no real pain. She is eating well and having normal bowel function. She is denying any drainage from the area and is very happy with her surgical results.  Past Medical History:  Diagnosis Date  . Cyst of left clavicle 03/20/2017  . GERD (gastroesophageal reflux disease)   . Seasonal allergies     Past Surgical History:  Procedure Laterality Date  . CESAREAN SECTION     X2  . LIPOMA EXCISION Left 04/17/2017   Procedure: EXCISION LIPOMA-LEFT CLAVICLE;  Surgeon: Clayburn Pert, MD;  Location: [facility] ORS;  Service: General;  Laterality: Left;  . TUBAL LIGATION      Family History  Problem Relation Age of Onset  . Cancer Mother 74       SKIN CANCER  . Migraines Mother   . Thyroid disease Mother   . Diabetes Father   . Heart failure Father   . Hyperlipidemia Father   . Hypertension Father     Social History:  reports that she has been smoking Cigarettes.  She has never used smokeless tobacco. She reports that she drinks about 0.6 oz of alcohol per week . She reports that she does not use drugs.  Allergies: No Known Allergies  Medications reviewed.    ROS A multipoint review of systems was completed, all pertinent positives and negatives are documented within the history of present illness and remainder are negative   BP 122/75   Pulse 86   Temp 98.5 F (36.9 C) (Oral)   Ht 5' 1.5" (1.562 m)   Wt 73.1 kg (161 lb 3.2 oz)   LMP 04/15/2017 (Exact Date)   BMI 29.97 kg/m   Physical Exam Gen.: No acute distress Chest: Clear to auscultation Heart: Regular rhythm Abdomen:  Soft and nontender excise skin: Excisional biopsy site from the left supraclavicular region is well approximated with Dermabond still in place. No evidence of erythema or drainage.    No results found for this or any previous visit (from the past 48 hour(s)). No results found.  Assessment/Plan:  1. Aftercare following surgery 46 year old female status post left supraclavicular lipoma excision. Pathology reviewed with patient. Discussed anticipated postoperative course and return to normal activities. She voiced understanding of all the clinic on an as-needed basis.     Clayburn Pert, MD FACS General Surgeon  04/24/2017,10:31 AM

## 2017-04-24 NOTE — Patient Instructions (Signed)
GENERAL POST-OPERATIVE PATIENT INSTRUCTIONS   WOUND CARE INSTRUCTIONS:   If clothing rubs against the wound or causes irritation and the wound is not draining you may cover it with a dry dressing during the daytime.  Try to keep the wound dry and avoid ointments on the wound unless directed to do so.  If the wound becomes bright red and painful or starts to drain infected material that is not clear, please contact your physician immediately.  If the wound is mildly pink and has a thick firm ridge underneath it, this is normal, and is referred to as a healing ridge.  This will resolve over the next 4-6 weeks.  BATHING: You may shower if you have been informed of this by your surgeon. However, Please do not submerge in a tub, hot tub, or pool until incisions are completely sealed or have been told by your surgeon that you may do so.  DIET:  You may eat any foods that you can tolerate.  It is a good idea to eat a high fiber diet and take in plenty of fluids to prevent constipation.  If you do become constipated you may want to take a mild laxative or take ducolax tablets on a daily basis until your bowel habits are regular.  Constipation can be very uncomfortable, along with straining, after recent surgery.  ACTIVITY:  You are encouraged to cough and deep breath or use your incentive spirometer if you were given one, every 15-30 minutes when awake.  This will help prevent respiratory complications and low grade fevers post-operatively if you had a general anesthetic.  You may want to hug a pillow when coughing and sneezing to add additional support to the surgical area, if you had abdominal or chest surgery, which will decrease pain during these times.  You are encouraged to walk and engage in light activity for the next two weeks.  You should not lift more than 20 pounds, until 05/29/2017 as it could put you at increased risk for complications.  Twenty pounds is roughly equivalent to a plastic bag of  groceries. At that time- Listen to your body when lifting, if you have pain when lifting, stop and then try again in a few days. Soreness after doing exercises or activities of daily living is normal as you get back in to your normal routine.  MEDICATIONS:  Try to take narcotic medications and anti-inflammatory medications, such as tylenol, ibuprofen, naprosyn, etc., with food.  This will minimize stomach upset from the medication.  Should you develop nausea and vomiting from the pain medication, or develop a rash, please discontinue the medication and contact your physician.  You should not drive, make important decisions, or operate machinery when taking narcotic pain medication.  SUNBLOCK Use sun block to incision area over the next year if this area will be exposed to sun. This helps decrease scarring and will allow you avoid a permanent darkened area over your incision.  QUESTIONS:  Please feel free to call our office if you have any questions, and we will be glad to assist you. [phone]

## 2017-06-20 ENCOUNTER — Ambulatory Visit
Admission: RE | Admit: 2017-06-20 | Discharge: 2017-06-20 | Disposition: A | Discharge status: Home / Self Care | Payer: BLUE CROSS/BLUE SHIELD | Source: Ambulatory Visit | Attending: Obstetrics and Gynecology | Admitting: Obstetrics and Gynecology

## 2017-06-20 DIAGNOSIS — Z1231 Encounter for screening mammogram for malignant neoplasm of breast: Secondary | ICD-10-CM | POA: Insufficient documentation

## 2017-06-20 DIAGNOSIS — Z01419 Encounter for gynecological examination (general) (routine) without abnormal findings: Secondary | ICD-10-CM

## 2017-06-30 ENCOUNTER — Inpatient Hospital Stay
Admission: RE | Admit: 2017-06-30 | Discharge: 2017-06-30 | Disposition: A | Discharge status: Home / Self Care | Payer: Self-pay | Source: Ambulatory Visit | Attending: *Deleted | Admitting: *Deleted

## 2017-06-30 ENCOUNTER — Other Ambulatory Visit: Payer: Self-pay | Admitting: *Deleted

## 2017-06-30 DIAGNOSIS — Z9289 Personal history of other medical treatment: Secondary | ICD-10-CM

## 2018-03-20 ENCOUNTER — Encounter: Payer: BLUE CROSS/BLUE SHIELD | Admitting: Obstetrics and Gynecology

## 2018-06-11 ENCOUNTER — Other Ambulatory Visit: Payer: Self-pay | Admitting: Family Medicine

## 2018-06-11 DIAGNOSIS — Z1231 Encounter for screening mammogram for malignant neoplasm of breast: Secondary | ICD-10-CM

## 2018-07-13 ENCOUNTER — Ambulatory Visit
Admission: RE | Admit: 2018-07-13 | Discharge: 2018-07-13 | Disposition: A | Discharge status: Home / Self Care | Payer: BLUE CROSS/BLUE SHIELD | Source: Ambulatory Visit | Attending: Family Medicine | Admitting: Family Medicine

## 2018-07-13 DIAGNOSIS — Z1231 Encounter for screening mammogram for malignant neoplasm of breast: Secondary | ICD-10-CM | POA: Diagnosis present

## 2019-09-07 ENCOUNTER — Other Ambulatory Visit: Payer: Self-pay | Admitting: Family Medicine

## 2019-09-07 ENCOUNTER — Other Ambulatory Visit: Payer: Self-pay | Admitting: *Deleted

## 2019-09-07 DIAGNOSIS — Z1231 Encounter for screening mammogram for malignant neoplasm of breast: Secondary | ICD-10-CM

## 2019-09-29 ENCOUNTER — Ambulatory Visit
Admission: RE | Admit: 2019-09-29 | Discharge: 2019-09-29 | Disposition: A | Discharge status: Home / Self Care | Payer: BC Managed Care – PPO | Source: Ambulatory Visit | Attending: Family Medicine | Admitting: Family Medicine

## 2019-09-29 DIAGNOSIS — Z1231 Encounter for screening mammogram for malignant neoplasm of breast: Secondary | ICD-10-CM | POA: Insufficient documentation

## 2019-12-27 ENCOUNTER — Other Ambulatory Visit: Payer: Self-pay

## 2019-12-27 ENCOUNTER — Ambulatory Visit (INDEPENDENT_AMBULATORY_CARE_PROVIDER_SITE_OTHER): Payer: BC Managed Care – PPO | Admitting: Dermatology

## 2019-12-27 ENCOUNTER — Other Ambulatory Visit: Payer: Self-pay | Admitting: Dermatology

## 2019-12-27 DIAGNOSIS — B029 Zoster without complications: Secondary | ICD-10-CM | POA: Diagnosis not present

## 2019-12-27 DIAGNOSIS — R208 Other disturbances of skin sensation: Secondary | ICD-10-CM

## 2019-12-27 DIAGNOSIS — W57XXXA Bitten or stung by nonvenomous insect and other nonvenomous arthropods, initial encounter: Secondary | ICD-10-CM | POA: Diagnosis not present

## 2019-12-27 MED ORDER — CLOBETASOL PROPIONATE 0.05 % EX CREA: 1.0000 "application " | TOPICAL_CREAM | Freq: Two times a day (BID) | CUTANEOUS | 0 refills | Status: DC

## 2019-12-27 MED ORDER — VALACYCLOVIR HCL 1 G PO TABS: 1000.0000 mg | ORAL_TABLET | Freq: Three times a day (TID) | ORAL | 0 refills | Status: DC

## 2019-12-27 NOTE — Progress Notes (Signed)
   Follow-Up Visit   Subjective  Sharon Hickman is a 49 y.o. female who presents for the following: Rash (back, R axilla ~1wk, tried calimine lotion, cortisone cream, itchy, burns and painful).  Noticed tingling in area first, then painful rash came.   The following portions of the chart were reviewed this encounter and updated as appropriate:      Review of Systems:  No other skin or systemic complaints except as noted in HPI or Assessment and Plan.  Objective  Well appearing patient in no apparent distress; mood and affect are within normal limits.  A focused examination was performed including back, R axilla. Relevant physical exam findings are noted in the Assessment and Plan.  Objective  L upper shoulder: Pink edematous papule  Objective  Right Upper Back, Right Axilla: R spinal mid and upper back clustered pink edematous paps, R axilla clustered vesicles on erythematous base   Assessment & Plan  Bug bite without infection, initial encounter L upper shoulder  Start Clobetasol cr qd/bid until cleared  Herpes zoster without complication Right Upper Back, Right Axilla  Start Valtrex 1g tid for 7 days Start Clobetasol cr qd/bid aa rash on back, avoid f/g/a Start otc HC cream to R axilla qd/bid Discussed potential post-herpetic neuralgia Discussed potential contagion if in contact with someone without a prior infection of chicken pox.  valACYclovir (VALTREX) 1000 MG tablet - Right Upper Back, Right Axilla  clobetasol cream (TEMOVATE) 0.05 % - Right Upper Back, Right Axilla  Other Related Procedures Virus culture  Skin pain  Return if symptoms worsen or fail to improve.    Documentation: I have reviewed the above documentation for accuracy and completeness, and I agree with the above.  Brendolyn Patty, MD   I, Othelia Pulling, RMA, am acting as scribe for Brendolyn Patty, MD .

## 2020-01-04 LAB — VIRUS CULTURE

## 2020-01-11 ENCOUNTER — Telehealth: Payer: Self-pay

## 2020-01-11 NOTE — Telephone Encounter (Signed)
Advised patient viral culture was negative, but that doesn't rule out shingles. She did state that the rash on her back is doing much better. The area under her arm is improved, but not clear yet. She will continue 1% hydrocortisone cream until improved. If not improving, she will call back.

## 2020-10-10 ENCOUNTER — Other Ambulatory Visit: Payer: Self-pay

## 2020-10-10 ENCOUNTER — Emergency Department
Admission: EM | Admit: 2020-10-10 | Discharge: 2020-10-10 | Disposition: A | Discharge status: Home / Self Care | Payer: BC Managed Care – PPO | Attending: Emergency Medicine | Admitting: Emergency Medicine

## 2020-10-10 ENCOUNTER — Emergency Department: Payer: BC Managed Care – PPO

## 2020-10-10 DIAGNOSIS — M542 Cervicalgia: Secondary | ICD-10-CM | POA: Insufficient documentation

## 2020-10-10 DIAGNOSIS — M549 Dorsalgia, unspecified: Secondary | ICD-10-CM | POA: Diagnosis not present

## 2020-10-10 DIAGNOSIS — J9811 Atelectasis: Secondary | ICD-10-CM | POA: Diagnosis not present

## 2020-10-10 DIAGNOSIS — R202 Paresthesia of skin: Secondary | ICD-10-CM | POA: Diagnosis not present

## 2020-10-10 DIAGNOSIS — R079 Chest pain, unspecified: Secondary | ICD-10-CM | POA: Diagnosis not present

## 2020-10-10 DIAGNOSIS — R0789 Other chest pain: Secondary | ICD-10-CM | POA: Diagnosis not present

## 2020-10-10 DIAGNOSIS — R030 Elevated blood-pressure reading, without diagnosis of hypertension: Secondary | ICD-10-CM | POA: Diagnosis not present

## 2020-10-10 DIAGNOSIS — F17210 Nicotine dependence, cigarettes, uncomplicated: Secondary | ICD-10-CM | POA: Insufficient documentation

## 2020-10-10 LAB — CBC
HCT: 41.8 % (ref 36.0–46.0)
Hemoglobin: 14.6 g/dL (ref 12.0–15.0)
MCH: 30.9 pg (ref 26.0–34.0)
MCHC: 34.9 g/dL (ref 30.0–36.0)
MCV: 88.6 fL (ref 80.0–100.0)
Platelets: 201 10*3/uL (ref 150–400)
RBC: 4.72 MIL/uL (ref 3.87–5.11)
RDW: 12.2 % (ref 11.5–15.5)
WBC: 6.5 10*3/uL (ref 4.0–10.5)
nRBC: 0 % (ref 0.0–0.2)

## 2020-10-10 LAB — POC URINE PREG, ED: Preg Test, Ur: NEGATIVE

## 2020-10-10 LAB — BASIC METABOLIC PANEL
Anion gap: 7 (ref 5–15)
BUN: 15 mg/dL (ref 6–20)
CO2: 25 mmol/L (ref 22–32)
Calcium: 9.4 mg/dL (ref 8.9–10.3)
Chloride: 103 mmol/L (ref 98–111)
Creatinine, Ser: 0.72 mg/dL (ref 0.44–1.00)
GFR, Estimated: >60 mL/min (ref >60)
Glucose, Bld: 85 mg/dL (ref 70–99)
Potassium: 3.8 mmol/L (ref 3.5–5.1)
Sodium: 135 mmol/L (ref 135–145)

## 2020-10-10 LAB — TROPONIN I (HIGH SENSITIVITY)
Troponin I (High Sensitivity): <2 ng/L (ref <18)
Troponin I (High Sensitivity): <2 ng/L (ref <18)

## 2020-10-10 MED ORDER — METHYLPREDNISOLONE 4 MG PO TBPK: ORAL_TABLET | ORAL | 0 refills | Status: DC

## 2020-10-10 MED ORDER — IOHEXOL 350 MG/ML SOLN
75.0000 mL | Freq: Once | INTRAVENOUS | Status: AC | PRN
  Administered 2020-10-10: 75 mL via INTRAVENOUS
  Filled 2020-10-10: qty 75

## 2020-10-10 NOTE — Discharge Instructions (Addendum)
Follow-up with Dr. Rockey Situ, please call for an appointment Return to the ER if worsening Take otc tylenol or advil for pain if needed Recheck blood pressure in 2 days, if still elevated please call your regular doctor for evaluation

## 2020-10-10 NOTE — ED Provider Notes (Signed)
[facility] Emergency Department Provider Note  ____________________________________________   Event Date/Time   First MD Initiated Contact with Patient 10/10/20 1714     (approximate)  I have reviewed the triage vital signs and the nursing notes.   HISTORY  Chief Complaint Chest Pain    HPI Sharon Hickman is a 50 y.o. female presents emergency department complaining of left-sided chest pain that got severe last night.  Patient states that its been ongoing with mild symptoms for the last 2 weeks.  States last night she had pain from the left side of her neck into her chest and into her back.  Family history of CAD.  States her father started having heart problems close to this age.  States been under a lot of stress lately.  No recent Covid infection.  States the pain is constant and throbbing in the left upper lung area.  Had one episode of numbness that went into the left arm down to the fingers.  She denies fever or chills.  No cough or congestion.    Past Medical History:  Diagnosis Date  . Cyst of left clavicle 03/20/2017  . GERD (gastroesophageal reflux disease)   . Seasonal allergies     Patient Active Problem List   Diagnosis Date Noted  . Lipoma of left shoulder   . Lipoma of anterior chest wall 04/03/2017  . Cyst of left clavicle 03/20/2017    Past Surgical History:  Procedure Laterality Date  . CESAREAN SECTION     X2  . LIPOMA EXCISION Left 04/17/2017   Procedure: EXCISION LIPOMA-LEFT CLAVICLE;  Surgeon: Clayburn Pert, MD;  Location: [facility] ORS;  Service: General;  Laterality: Left;  . TUBAL LIGATION      Prior to Admission medications   Medication Sig Start Date End Date Taking? Authorizing Provider  methylPREDNISolone (MEDROL DOSEPAK) 4 MG TBPK tablet Take 6 pills on day one then decrease by 1 pill each day 10/10/20  Yes Jarita Raval, Linden Dolin, PA-C  Cholecalciferol (VITAMIN D-3) 5000 units TABS Take 5,000 Units by mouth daily.     [provider]  clobetasol cream (TEMOVATE) 1.61 % Apply 1 application topically 2 (two) times daily. Bid to aa rash on back until clear, avoid face, groin, axilla 12/27/19   Brendolyn Patty, MD  cyanocobalamin (,VITAMIN B-12,) 1000 MCG/ML injection Inject 1 mL (1,000 mcg total) into the muscle every 30 (thirty) days. 03/19/17   Shambley, Melody N, CNM  Ibuprofen 200 MG CAPS Take 200-400 mg by mouth daily as needed (pain).    [provider]  omeprazole (PRILOSEC OTC) 20 MG tablet Take 20 mg by mouth as needed.    [provider]  PRESCRIPTION MEDICATION Inject 1 Dose as directed every 4 (four) months. Hormone replacement pellet administered at Dr's offices    [provider]  valACYclovir (VALTREX) 1000 MG tablet Take 1 tablet (1,000 mg total) by mouth 3 (three) times daily. 12/27/19   Brendolyn Patty, MD    Allergies Patient has no known allergies.  Family History  Problem Relation Age of Onset  . Cancer Mother 78       SKIN CANCER  . Migraines Mother   . Thyroid disease Mother   . Diabetes Father   . Heart failure Father   . Hyperlipidemia Father   . Hypertension Father   . Breast cancer Maternal Aunt 47  . Breast cancer Cousin 104  . Breast cancer Sister 54  . Breast cancer Maternal Aunt 55  Social History Social History   Tobacco Use  . Smoking status: Light Tobacco Smoker    Types: Cigarettes  . Smokeless tobacco: Never Used  . Tobacco comment: when stressed  Vaping Use  . Vaping Use: Never used  Substance Use Topics  . Alcohol use: Yes    Alcohol/week: 1.0 standard drink    Types: 1 Glasses of wine per week    Comment: rarely-2 X PER YEAR  . Drug use: No    Review of Systems  Constitutional: No fever/chills Eyes: No visual changes. ENT: No sore throat. Respiratory: Denies cough Cardiovascular: Positive chest pain Gastrointestinal: Denies abdominal pain Genitourinary: Negative for dysuria. Musculoskeletal: Negative for back  pain. Skin: Negative for rash.   Psychiatric: no mood changes,     ____________________________________________   PHYSICAL EXAM:  VITAL SIGNS: ED Triage Vitals  Enc Vitals Group     BP 10/10/20 1509 (!) 162/98     Pulse Rate 10/10/20 1509 68     Resp 10/10/20 1509 19     Temp 10/10/20 1509 98.4 F (36.9 C)     Temp src --      SpO2 10/10/20 1509 100 %     Weight --      Height --      Head Circumference --      Peak Flow --      Pain Score 10/10/20 1506 3     Pain Loc --      Pain Edu? --      Excl. in [place]? --     Constitutional: Alert and oriented. Well appearing and in no acute distress. Eyes: Conjunctivae are normal.  Head: Atraumatic. Nose: No congestion/rhinnorhea. Mouth/Throat: Mucous membranes are moist.   Neck:  supple no lymphadenopathy noted Cardiovascular: Normal rate, regular rhythm. Heart sounds are normal Respiratory: Normal respiratory effort.  No retractions, lungs c t a  GU: deferred Musculoskeletal: FROM all extremities, warm and well perfused, C-spine is nontender, left shoulder is nontender Neurologic:  Normal speech and language.  Skin:  Skin is warm, dry and intact. No rash noted. Psychiatric: Mood and affect are normal. Speech and behavior are normal.  ____________________________________________   LABS (all labs ordered are listed, but only abnormal results are displayed)  Labs Reviewed  BASIC METABOLIC PANEL  CBC  POC URINE PREG, ED  TROPONIN I (HIGH SENSITIVITY)  TROPONIN I (HIGH SENSITIVITY)   ____________________________________________   ____________________________________________  RADIOLOGY  Chest x-ray CTA PE  ____________________________________________   PROCEDURES  Procedure(s) performed: EKG, see physician read  Procedures    ____________________________________________   INITIAL IMPRESSION / ASSESSMENT AND PLAN / ED COURSE  Pertinent labs & imaging results that were available during my care of the  patient were reviewed by me and considered in my medical decision making (see chart for details).   Patient is 50 year old female presents with left-sided chest pain that radiates to the left side of the neck and into her back.  Stabbing pain last night that woke her up out of her sleep.  See HPI.  Physical exam shows patient to appear well.  Blood pressure is mildly elevated.  Heart sounds are normal.  DDx: MI, angina, PE, cervical radiculopathy, muscle strain  Labs are reassuring, basic metabolic panel, CBC and troponin are all normal,  EKG shows normal sinus rhythm, there is no old EKG for comparison  Chest x-ray reviewed by me and confirmed by radiology as normal  CTA for PE  CTA for PE is negative.  Reexamination of the patient shows that she is still stable.  Area is tender to palpation on the chest.  Possible that this is a muscle strain due to the heavy purse that she carries.  However due to her family history I feel that she should follow-up with cardiology.  She is to call and make an appointment.  Her preference is Dr. Rockey Situ.  I did give her his phone number.  She is also to follow-up with her regular doctor if not improving in 2 to 3 days.  Return emergency department for worsening.  She was discharged in stable condition in the care of her husband.     Sharon Hickman was evaluated in Emergency Department on 10/10/2020 for the symptoms described in the history of present illness. She was evaluated in the context of the global COVID-19 pandemic, which necessitated consideration that the patient might be at risk for infection with the SARS-CoV-2 virus that causes COVID-19. Institutional protocols and algorithms that pertain to the evaluation of patients at risk for COVID-19 are in a state of rapid change based on information released by regulatory bodies including the CDC and federal and state organizations. These policies and algorithms were followed during the patient's care in  the ED.    As part of my medical decision making, I reviewed the following data within the [place] History obtained from family, Nursing notes reviewed and incorporated, Labs reviewed , EKG interpreted NSR, Old chart reviewed, Radiograph reviewed , Notes from prior ED visits and [place] Controlled Substance Database  ____________________________________________   FINAL CLINICAL IMPRESSION(S) / ED DIAGNOSES  Final diagnoses:  Nonspecific chest pain      NEW MEDICATIONS STARTED DURING THIS VISIT:  New Prescriptions   METHYLPREDNISOLONE (MEDROL DOSEPAK) 4 MG TBPK TABLET    Take 6 pills on day one then decrease by 1 pill each day     Note:  This document was prepared using Dragon voice recognition software and may include unintentional dictation errors.    Versie Starks, PA-C 10/10/20 1939    Vladimir Crofts, MD 10/16/20 [phone]

## 2020-10-10 NOTE — ED Notes (Signed)
Pt ambulatory to the restroom at this time.  

## 2020-10-10 NOTE — ED Triage Notes (Signed)
Pt comes from [facility] with c/o left sided CP that radiates to left side of neck and to back. Pt states it was stabbing last night and woke her up in her sleep. Pt denies any SOB. Pt states some tingling in her fingers and sharp pain that went down her neck. Pt states soreness to chest.  Pt denies any N/V/D.  Pt states this started 2 weeks ago and has just been on and off.

## 2020-10-17 ENCOUNTER — Other Ambulatory Visit: Payer: Self-pay | Admitting: Family Medicine

## 2020-10-17 DIAGNOSIS — Z1231 Encounter for screening mammogram for malignant neoplasm of breast: Secondary | ICD-10-CM

## 2020-10-19 ENCOUNTER — Ambulatory Visit: Payer: BC Managed Care – PPO | Admitting: Cardiology

## 2020-10-19 DIAGNOSIS — Z8249 Family history of ischemic heart disease and other diseases of the circulatory system: Secondary | ICD-10-CM | POA: Diagnosis not present

## 2020-10-19 DIAGNOSIS — M25512 Pain in left shoulder: Secondary | ICD-10-CM | POA: Diagnosis not present

## 2020-10-19 DIAGNOSIS — R079 Chest pain, unspecified: Secondary | ICD-10-CM | POA: Diagnosis not present

## 2020-10-23 DIAGNOSIS — R079 Chest pain, unspecified: Secondary | ICD-10-CM | POA: Diagnosis not present

## 2020-10-25 DIAGNOSIS — R079 Chest pain, unspecified: Secondary | ICD-10-CM | POA: Diagnosis not present

## 2020-10-25 DIAGNOSIS — Z8249 Family history of ischemic heart disease and other diseases of the circulatory system: Secondary | ICD-10-CM | POA: Diagnosis not present

## 2021-02-15 ENCOUNTER — Other Ambulatory Visit: Payer: Self-pay

## 2021-02-15 ENCOUNTER — Ambulatory Visit
Admission: RE | Admit: 2021-02-15 | Discharge: 2021-02-15 | Disposition: A | Discharge status: Home / Self Care | Payer: BC Managed Care – PPO | Source: Ambulatory Visit | Attending: Family Medicine | Admitting: Family Medicine

## 2021-02-15 DIAGNOSIS — Z1231 Encounter for screening mammogram for malignant neoplasm of breast: Secondary | ICD-10-CM | POA: Diagnosis not present

## 2021-06-13 ENCOUNTER — Ambulatory Visit (INDEPENDENT_AMBULATORY_CARE_PROVIDER_SITE_OTHER): Payer: BC Managed Care – PPO | Admitting: Dermatology

## 2021-06-13 ENCOUNTER — Encounter: Payer: Self-pay | Admitting: Dermatology

## 2021-06-13 ENCOUNTER — Other Ambulatory Visit: Payer: Self-pay

## 2021-06-13 DIAGNOSIS — D2362 Other benign neoplasm of skin of left upper limb, including shoulder: Secondary | ICD-10-CM

## 2021-06-13 DIAGNOSIS — C44619 Basal cell carcinoma of skin of left upper limb, including shoulder: Secondary | ICD-10-CM

## 2021-06-13 DIAGNOSIS — L821 Other seborrheic keratosis: Secondary | ICD-10-CM

## 2021-06-13 DIAGNOSIS — D492 Neoplasm of unspecified behavior of bone, soft tissue, and skin: Secondary | ICD-10-CM

## 2021-06-13 DIAGNOSIS — C4491 Basal cell carcinoma of skin, unspecified: Secondary | ICD-10-CM

## 2021-06-13 DIAGNOSIS — L814 Other melanin hyperpigmentation: Secondary | ICD-10-CM | POA: Diagnosis not present

## 2021-06-13 HISTORY — DX: Basal cell carcinoma of skin, unspecified: C44.91

## 2021-06-13 NOTE — Patient Instructions (Addendum)
Wound Care Instructions  Cleanse wound gently with soap and water once a day then pat dry with clean gauze. Apply a thing coat of Petrolatum (petroleum jelly, "Vaseline") over the wound (unless you have an allergy to this). We recommend that you use a new, sterile tube of Vaseline. Do not pick or remove scabs. Do not remove the yellow or white "healing tissue" from the base of the wound.  Cover the wound with fresh, clean, nonstick gauze and secure with paper tape. You may use Band-Aids in place of gauze and tape if the would is small enough, but would recommend trimming much of the tape off as there is often too much. Sometimes Band-Aids can irritate the skin.  You should call the office for your biopsy report after 1 week if you have not already been contacted.  If you experience any problems, such as abnormal amounts of bleeding, swelling, significant bruising, significant pain, or evidence of infection, please call the office immediately.  FOR ADULT SURGERY PATIENTS: If you need something for pain relief you may take 1 extra strength Tylenol (acetaminophen) AND 2 Ibuprofen (200mg  each) together every 4 hours as needed for pain. (do not take these if you are allergic to them or if you have a reason you should not take them.) Typically, you may only need pain medication for 1 to 3 days.   Basal Cell Carcinoma Basal cell carcinoma is the most common form of skin cancer. It begins in the basal cells, which are at the bottom of the outer skin layer (epidermis). Basal cell carcinoma can almost always be cured. It rarely spreads to other areas of the body (metastasizes). It may come back at the same location (recur), but it can be treated again if this happens. Basal cell carcinoma occurs most often on parts of the body that are frequently exposed to the sun, such as: Parts of the head, including the scalp or face. Ears. Neck. Arms or legs. Backs of the hands. What are the causes? This condition is  usually caused by exposure to ultraviolet (UV) light. UV light may come from the sun or from tanning beds. Other causes include: Exposure to a highly poisonous metal (arsenic). Exposure to high-energy X-rays (radiation). Exposure to toxic tars and oils. Certain genetic conditions, such as a condition that makes a person sensitive to sunlight (xeroderma pigmentosum). What increases the risk? You are more likely to develop this condition if: You are older than 48 years of age. You have: Fair skin (light complexion). Blond or red hair. Blue, green, or gray eyes. Childhood freckling. Had sun exposure over long periods of time, especially during childhood. Had repeated sunburns. A weakened immune system. Been exposed to certain chemicals, such as tar, soot, and arsenic. Chronic inflammatory conditions. Chronic infections. You use tanning beds. What are the signs or symptoms? The main symptom of this condition is a growth or lesion on the skin. The shape and color of the growth or lesion may vary. The main types include: An open sore that may remain open for 3 weeks or longer. The sore may bleed or crust. This type of lesion can be an early sign of basal cell carcinoma. Basal cell carcinoma often shows up as a sore that does not heal. A reddish area that may crust, itch, or cause discomfort. This may occur on areas that are exposed to the sun. These patches might be easier to feel than to see. A shiny or clear bump that is red, white,  or pink. In people who have dark hair, the bump is often tan, black, or brown. These bumps can look like moles. A pink growth with a raised border. The growth will have a crusted and indented area in the center. Small blood vessels may appear on the surface of the growth as it gets bigger. A scar-like area that looks like shiny, stretched skin. The area may be white, yellow, or waxy. It often has irregular borders. This may be a sign of more aggressive basal cell  carcinoma. How is this diagnosed? This condition may be diagnosed with: A physical exam. Removal of a tissue sample to be examined under a microscope (biopsy). How is this treated? Treatment for this condition involves removing the cancerous tissue. The method that is used for this depends on the type, size, location, and number of tumors. Possible treatments include: Mohs surgery. In this procedure, the cancerous skin cells are removed layer by layer until all of the tumor has been removed. Surgical removal (excision) of the tumor. This involves removing the entire tumor and a small amount of normal skin that surrounds it. Cryosurgery. This involves freezing the tumor with liquid nitrogen. Plastic surgery. The tumor is removed, and healthy skin from another part of the body is used to cover the wound. This may be done for large tumors that are in areas where it is not possible to stretch the nearby skin to sew the edges of the wound together. Radiation. This may be used for tumors on the face. Photodynamic therapy. A chemical cream is applied to the skin, and light exposure is used to activate the chemical. Electrodesiccation and curettage. This involves alternately scraping and burning the tumor while using an electric current to control bleeding. Chemical treatments, such as imiquimod cream and interferon injections. These may be used to remove superficial tumors with minimal scarring. Follow these instructions at home: Avoid direct exposure to the sun. Do self-exams as told by your health care provider. Look for new spots or changes in your skin. Keep all follow-up visits as told by your health care provider. This is important. How is this prevented?  Avoid the sun when it is the strongest. This is usually between 10 a.m. and 4 p.m. When you are out in the sun, use a sunscreen that has a sun protection factor (SPF) of at least 1. Apply sunscreen at least 30 minutes before exposure to the  sun. Reapply sunscreen every 2-4 hours while you are outside. Also reapply it after swimming and after excessive sweating. Always wear hats, protective clothing, and UV-blocking sunglasses when you are outdoors. Do not use tanning beds. Contact a health care provider if you: Notice any new spots or any changes in your skin. Have had a basal cell carcinoma tumor removed, and you notice a new growth in the same location. Get help right away if you have a spot that: Is sore and does not heal. Bleeds easily with minor injury. Summary Basal cell carcinoma is the most common form of skin cancer. It begins in the bottom of the outer skin layer (epidermis). Basal cell carcinoma can almost always be cured. This condition is usually caused by exposure to ultraviolet (UV) light. It mostly affects the face, scalp, neck, ears, arms, legs, or backs of the hands. The main symptom of this condition is a growth or lesion on the skin that can vary in shape and color. You can prevent this cancer by avoiding direct exposure to the sun, applying sunscreen  of at least 35 SPF, and wearing protective clothing. Apply sunscreen 30 minutes before you go out into the sun, and reapply every 2-4 hours while you are outside. This information is not intended to replace advice given to you by your health care provider. Make sure you discuss any questions you have with your health care provider. Document Revised: 12/31/2017 Document Reviewed: 12/31/2017 Elsevier Patient Education  2022 [employer].   If you have any questions or concerns for your doctor, please call our main line at [phone] and press option 4 to reach your doctor's medical assistant. If no one answers, please leave a voicemail as directed and we will return your call as soon as possible. Messages left after 4 pm will be answered the following business day.   You may also send [place] a message via [place]. We typically respond to MyChart messages within 1-2  business days.  For prescription refills, please ask your pharmacy to contact our office. Our fax number is [phone].  If you have an urgent issue when the clinic is closed that cannot wait until the next business day, you can page your doctor at the number below.    Please note that while we do our best to be available for urgent issues outside of office hours, we are not available 24/7.   If you have an urgent issue and are unable to reach [place], you may choose to seek medical care at your doctor's office, retail clinic, urgent care center, or emergency room.  If you have a medical emergency, please immediately call 911 or go to the emergency department.  Pager Numbers  - Dr. Nehemiah Massed: [phone]  - Dr. Laurence Ferrari: [phone]  - Dr. Nicole Kindred: [phone]  In the event of inclement weather, please call our main line at [phone] for an update on the status of any delays or closures.  Dermatology Medication Tips: Please keep the boxes that topical medications come in in order to help keep track of the instructions about where and how to use these. Pharmacies typically print the medication instructions only on the boxes and not directly on the medication tubes.   If your medication is too expensive, please contact our office at [phone] option 4 or send [place] a message through [employer].   We are unable to tell what your co-pay for medications will be in advance as this is different depending on your insurance coverage. However, we may be able to find a substitute medication at lower cost or fill out paperwork to get insurance to cover a needed medication.   If a prior authorization is required to get your medication covered by your insurance company, please allow [place] 1-2 business days to complete this process.  Drug prices often vary depending on where the prescription is filled and some pharmacies may offer cheaper prices.  The website www.goodrx.com contains coupons for medications  through different pharmacies. The prices here do not account for what the cost may be with help from insurance (it may be cheaper with your insurance), but the website can give you the price if you did not use any insurance.  - You can print the associated coupon and take it with your prescription to the pharmacy.  - You may also stop by our office during regular business hours and pick up a GoodRx coupon card.  - If you need your prescription sent electronically to a different pharmacy, notify our office through [facility] or by phone at [phone] option 4. [phone]

## 2021-06-13 NOTE — Progress Notes (Signed)
   Follow-Up Visit   Subjective  Sharon Hickman is a 50 y.o. female who presents for the following: Skin Problem (Check area on back. Was seen for this last year. Thought was shingles. Treated with valtrex. Viral culture was negative. Worsening x 6 months. Painful. Has depression on back at site. Never went completely away, stopped hurting for a while. ).    The following portions of the chart were reviewed this encounter and updated as appropriate:      Review of Systems: No other skin or systemic complaints except as noted in HPI or Assessment and Plan.   Objective  Well appearing patient in no apparent distress; mood and affect are within normal limits.  All skin waist up examined.  Left Medial Shoulder - Posterior 1.3 cm pearly ulcerated plaque     Assessment & Plan  Neoplasm of skin Left Medial Shoulder - Posterior  Skin / nail biopsy Type of biopsy: tangential   Informed consent: discussed and consent obtained   Anesthesia: the lesion was anesthetized in a standard fashion   Anesthesia comment:  Area prepped with alcohol Anesthetic:  1% lidocaine w/ epinephrine 1-100,000 buffered w/ 8.4% NaHCO3 Instrument used: flexible razor blade   Hemostasis achieved with: pressure, aluminum chloride and electrodesiccation   Outcome: patient tolerated procedure well    Destruction of lesion  Destruction method: electrodesiccation and curettage   Informed consent: discussed and consent obtained   Timeout:  patient name, date of birth, surgical site, and procedure verified Curettage performed in three different directions: Yes   Electrodesiccation performed over the curetted area: Yes   Lesion length (cm):  1.3 Lesion width (cm):  1.3 Margin per side (cm):  0.1 Final wound size (cm):  1.5 Hemostasis achieved with:  pressure, aluminum chloride and electrodesiccation Outcome: patient tolerated procedure well with no complications   Post-procedure details: wound care  instructions given   Additional details:  Mupirocin ointment and Bandaid applied    Specimen 1 - Surgical pathology Differential Diagnosis: BCC   Check Margins: No  Dermatofibroma - Firm pink/brown papulenodule with dimple sign at top of left shoulder.  - Benign appearing - Call for any changes  Seborrheic Keratoses - Stuck-on, waxy, tan-brown papules and/or plaques at torso - Benign-appearing - Discussed benign etiology and prognosis. - Observe - Call for any changes  Lentigines - Scattered tan macules - Due to sun exposure - Benign-appering, observe - Recommend daily broad spectrum sunscreen SPF 30+ to sun-exposed areas, reapply every 2 hours as needed. - Call for any changes      Return in about 3 months (around 09/13/2021) for [facility] recheck.Loraine Maple, CMA, am acting as scribe for Brendolyn Patty, MD.  Documentation: I have reviewed the above documentation for accuracy and completeness, and I agree with the above.  Brendolyn Patty MD

## 2021-06-18 ENCOUNTER — Telehealth: Payer: Self-pay

## 2021-06-18 NOTE — Telephone Encounter (Signed)
Advised patient biopsy of the left medial post shoulder was [facility] and was treated with EDC at the time of biopsy.

## 2021-06-18 NOTE — Telephone Encounter (Signed)
-----   Message from Brendolyn Patty, MD sent at 06/18/2021 12:19 PM EDT ----- Skin , left medial shoulder - posterior BASAL CELL CARCINOMA, NODULAR AND INFILTRATIVE PATTERNS, BASE INVOLVED  BCC skin cancer- already treated with EDC at time of biopsy    - please call patient

## 2021-06-20 ENCOUNTER — Other Ambulatory Visit (HOSPITAL_COMMUNITY)
Admission: RE | Admit: 2021-06-20 | Discharge: 2021-06-20 | Disposition: A | Discharge status: Home / Self Care | Payer: BC Managed Care – PPO | Source: Ambulatory Visit | Attending: Obstetrics and Gynecology | Admitting: Obstetrics and Gynecology

## 2021-06-20 ENCOUNTER — Other Ambulatory Visit: Payer: Self-pay

## 2021-06-20 ENCOUNTER — Ambulatory Visit (INDEPENDENT_AMBULATORY_CARE_PROVIDER_SITE_OTHER): Payer: BC Managed Care – PPO | Admitting: Obstetrics and Gynecology

## 2021-06-20 ENCOUNTER — Encounter: Payer: Self-pay | Admitting: Obstetrics and Gynecology

## 2021-06-20 ENCOUNTER — Encounter: Payer: BLUE CROSS/BLUE SHIELD | Admitting: Certified Nurse Midwife

## 2021-06-20 VITALS — Ht 62.0 in | Wt 183.4 lb

## 2021-06-20 DIAGNOSIS — Z01419 Encounter for gynecological examination (general) (routine) without abnormal findings: Secondary | ICD-10-CM

## 2021-06-20 DIAGNOSIS — Z124 Encounter for screening for malignant neoplasm of cervix: Secondary | ICD-10-CM

## 2021-06-20 DIAGNOSIS — R399 Unspecified symptoms and signs involving the genitourinary system: Secondary | ICD-10-CM

## 2021-06-20 DIAGNOSIS — B9689 Other specified bacterial agents as the cause of diseases classified elsewhere: Secondary | ICD-10-CM

## 2021-06-20 DIAGNOSIS — Z7689 Persons encountering health services in other specified circumstances: Secondary | ICD-10-CM | POA: Diagnosis not present

## 2021-06-20 DIAGNOSIS — N898 Other specified noninflammatory disorders of vagina: Secondary | ICD-10-CM | POA: Diagnosis not present

## 2021-06-20 DIAGNOSIS — N760 Acute vaginitis: Secondary | ICD-10-CM

## 2021-06-20 LAB — POCT URINALYSIS DIPSTICK
Bilirubin, UA: NEGATIVE
Glucose, UA: NEGATIVE
Ketones, UA: NEGATIVE
Leukocytes, UA: NEGATIVE
Nitrite, UA: NEGATIVE
Protein, UA: NEGATIVE
Spec Grav, UA: 1.015 (ref 1.010–1.025)
Urobilinogen, UA: 0.2 E.U./dL
pH, UA: 6.5 (ref 5.0–8.0)

## 2021-06-20 NOTE — Patient Instructions (Signed)
Breast Self-Awareness Breast self-awareness is knowing how your breasts look and feel. Doing breast self-awareness is important. It allows you to catch a breast problem early while it is still small and can be treated. All women should do breast self-awareness, including women who have had breast implants. Tell your doctor if you notice a change in your breasts. What you need: A mirror. A well-lit room. How to do a breast self-exam A breast self-exam is one way to learn what is normal for your breasts and to check for changes. To do a breast self-exam: Look for changes  Take off all the clothes above your waist. Stand in front of a mirror in a room with good lighting. Put your hands on your hips. Push your hands down. Look at your breasts and nipples in the mirror to see if one breast or nipple looks different from the other. Check to see if: The shape of one breast is different. The size of one breast is different. There are wrinkles, dips, and bumps in one breast and not the other. Look at each breast for changes in the skin, such as: Redness. Scaly areas. Look for changes in your nipples, such as: Liquid around the nipples. Bleeding. Dimpling. Redness. A change in where the nipples are. Feel for changes  Lie on your back on the floor. Feel each breast. To do this, follow these steps: Pick a breast to feel. Put the arm closest to that breast above your head. Use your other arm to feel the nipple area of your breast. Feel the area with the pads of your three middle fingers by making small circles with your fingers. For the first circle, press lightly. For the second circle, press harder. For the third circle, press even harder. Keep making circles with your fingers at the different pressures as you move down your breast. Stop when you feel your ribs. Move your fingers a little toward the center of your body. Start making circles with your fingers again, this time going up until  you reach your collarbone. Keep making up-and-down circles until you reach your armpit. Remember to keep using the three pressures. Feel the other breast in the same way. Sit or stand in the tub or shower. With soapy water on your skin, feel each breast the same way you did in step 2 when you were lying on the floor. Write down what you find Writing down what you find can help you remember what to tell your doctor. Write down: What is normal for each breast. Any changes you find in each breast, including: The kind of changes you find. Whether you have pain. Size and location of any lumps. When you last had your menstrual period. General tips Check your breasts every month. If you are breastfeeding, the best time to check your breasts is after you feed your baby or after you use a breast pump. If you get menstrual periods, the best time to check your breasts is 5-7 days after your menstrual period is over. With time, you will become comfortable with the self-exam, and you will begin to know if there are changes in your breasts. Contact a doctor if you: See a change in the shape or size of your breasts or nipples. See a change in the skin of your breast or nipples, such as red or scaly skin. Have fluid coming from your nipples that is not normal. Find a lump or thick area that was not there before. Have pain in   your breasts. Have any concerns about your breast health. Summary Breast self-awareness includes looking for changes in your breasts, as well as feeling for changes within your breasts. Breast self-awareness should be done in front of a mirror in a well-lit room. You should check your breasts every month. If you get menstrual periods, the best time to check your breasts is 5-7 days after your menstrual period is over. Let your doctor know of any changes you see in your breasts, including changes in size, changes on the skin, pain or tenderness, or fluid from your nipples that is not  normal. This information is not intended to replace advice given to you by your health care provider. Make sure you discuss any questions you have with your health care provider. Document Revised: 03/31/2018 Document Reviewed: 03/31/2018 Elsevier Patient Education  2022 Elsevier Inc.       Preventive Care 51-37 Years Old, Female Preventive care refers to lifestyle choices and visits with your health care provider that can promote health and wellness. This includes: A yearly physical exam. This is also called an annual wellness visit. Regular dental and eye exams. Immunizations. Screening for certain conditions. Healthy lifestyle choices, such as: Eating a healthy diet. Getting regular exercise. Not using drugs or products that contain nicotine and tobacco. Limiting alcohol use. What can I expect for my preventive care visit? Physical exam Your health care provider will check your: Height and weight. These may be used to calculate your BMI (body mass index). BMI is a measurement that tells if you are at a healthy weight. Heart rate and blood pressure. Body temperature. Skin for abnormal spots. Counseling Your health care provider may ask you questions about your: Past medical problems. Family's medical history. Alcohol, tobacco, and drug use. Emotional well-being. Home life and relationship well-being. Sexual activity. Diet, exercise, and sleep habits. Work and work Statistician. Access to firearms. Method of birth control. Menstrual cycle. Pregnancy history. What immunizations do I need? Vaccines are usually given at various ages, according to a schedule. Your health care provider will recommend vaccines for you based on your age, medical history, and lifestyle or other factors, such as travel or where you work. What tests do I need? Blood tests Lipid and cholesterol levels. These may be checked every 5 years, or more often if you are over 96 years old. Hepatitis C  test. Hepatitis B test. Screening Lung cancer screening. You may have this screening every year starting at age 89 if you have a 30-pack-year history of smoking and currently smoke or have quit within the past 15 years. Colorectal cancer screening. All adults should have this screening starting at age 63 and continuing until age 66. Your health care provider may recommend screening at age 87 if you are at increased risk. You will have tests every 1-10 years, depending on your results and the type of screening test. Diabetes screening. This is done by checking your blood sugar (glucose) after you have not eaten for a while (fasting). You may have this done every 1-3 years. Mammogram. This may be done every 1-2 years. Talk with your health care provider about when you should start having regular mammograms. This may depend on whether you have a family history of breast cancer. BRCA-related cancer screening. This may be done if you have a family history of breast, ovarian, tubal, or peritoneal cancers. Pelvic exam and Pap test. This may be done every 3 years starting at age 77. Starting at age 53, this 37  5 years if you have a Pap test in combination with an HPV test. Other tests STD (sexually transmitted disease) testing, if you are at risk. Bone density scan. This is done to screen for osteoporosis. You may have this scan if you are at high risk for osteoporosis. Talk with your health care provider about your test results, treatment options, and if necessary, the need for more tests. Follow these instructions at home: Eating and drinking  Eat a diet that includes fresh fruits and vegetables, whole grains, lean protein, and low-fat dairy products. Take vitamin and mineral supplements as recommended by your health care provider. Do not drink alcohol if: Your health care provider tells you not to drink. You are pregnant, may be pregnant, or are planning to become pregnant. If  you drink alcohol: Limit how much you have to 0-1 drink a day. Be aware of how much alcohol is in your drink. In the [place] one drink equals one 12 oz bottle of beer (355 mL), one 5 oz glass of wine (148 mL), or one 1 oz glass of hard liquor (44 mL). Lifestyle Take daily care of your teeth and gums. Brush your teeth every morning and night with fluoride toothpaste. Floss one time each day. Stay active. Exercise for at least 30 minutes 5 or more days each week. Do not use any products that contain nicotine or tobacco, such as cigarettes, e-cigarettes, and chewing tobacco. If you need help quitting, ask your health care provider. Do not use drugs. If you are sexually active, practice safe sex. Use a condom or other form of protection to prevent STIs (sexually transmitted infections). If you do not wish to become pregnant, use a form of birth control. If you plan to become pregnant, see your health care provider for a prepregnancy visit. If told by your health care provider, take low-dose aspirin daily starting at age 50. Find healthy ways to cope with stress, such as: Meditation, yoga, or listening to music. Journaling. Talking to a trusted person. Spending time with friends and family. Safety Always wear your seat belt while driving or riding in a vehicle. Do not drive: If you have been drinking alcohol. Do not ride with someone who has been drinking. When you are tired or distracted. While texting. Wear a helmet and other protective equipment during sports activities. If you have firearms in your house, make sure you follow all gun safety procedures. What's next? Visit your health care provider once a year for an annual wellness visit. Ask your health care provider how often you should have your eyes and teeth checked. Stay up to date on all vaccines. This information is not intended to replace advice given to you by your health care provider. Make sure you discuss any questions you have  with your health care provider. Document Revised: 10/20/2020 Document Reviewed: 04/23/2018 Elsevier Patient Education  2022 Elsevier Inc.  

## 2021-06-20 NOTE — Progress Notes (Signed)
HPI:      [name] is a 50 y.o. [id number] who LMP was Patient's last menstrual period was 06/01/2021 (within days).  Subjective:   She presents today for her annual examination.  In addition she has 2 other issues.  She complains of vaginal itching and irritation that she believes is a yeast infection.  She has tried Monistat and believes it has not been successful.  She would like to be tested for vaginitis.  She denies new sexual partners and has no interest in being tested for STDs. Another issue is that she had significant pelvic pain last weekend with acute onset.  She describes the pain as severe.  Over approximately 24 hours the pain has slowly resolved.  She has also had some associated dark brown spotting at the same time as the pain.  She did some Musician and thinks she may have had a ruptured cyst. Of significant note patient has a tubal ligation for birth control.    Hx: The following portions of the patient's history were reviewed and updated as appropriate:             She  has a past medical history of Basal cell carcinoma (06/13/2021), Cyst of left clavicle (03/20/2017), GERD (gastroesophageal reflux disease), and Seasonal allergies. She does not have any pertinent problems on file. She  has a past surgical history that includes Tubal ligation; Cesarean section; and Lipoma excision (Left, 04/17/2017). Her family history includes Breast cancer (age of onset: 52) in her cousin; Breast cancer (age of onset: 13) in her maternal aunt, maternal aunt, and sister; Cancer (age of onset: 27) in her mother; Diabetes in her father; Heart failure in her father; Hyperlipidemia in her father; Hypertension in her father; Migraines in her mother; Thyroid disease in her mother. She  reports that she has quit smoking. Her smoking use included cigarettes. She has never used smokeless tobacco. She reports current alcohol use of about 1.0 standard drink per week. She reports that she does  not use drugs. She has a current medication list which includes the following prescription(s): ibuprofen, methylprednisolone, PRESCRIPTION MEDICATION, and valacyclovir. She has No Known Allergies.       Review of Systems:  Review of Systems  Constitutional: Denied constitutional symptoms, night sweats, recent illness, fatigue, fever, insomnia and weight loss.  Eyes: Denied eye symptoms, eye pain, photophobia, vision change and visual disturbance.  Ears/Nose/Throat/Neck: Denied ear, nose, throat or neck symptoms, hearing loss, nasal discharge, sinus congestion and sore throat.  Cardiovascular: Denied cardiovascular symptoms, arrhythmia, chest pain/pressure, edema, exercise intolerance, orthopnea and palpitations.  Respiratory: Denied pulmonary symptoms, asthma, pleuritic pain, productive sputum, cough, dyspnea and wheezing.  Gastrointestinal: Denied, gastro-esophageal reflux, melena, nausea and vomiting.  Genitourinary: See HPI for additional information.  Musculoskeletal: Denied musculoskeletal symptoms, stiffness, swelling, muscle weakness and myalgia.  Dermatologic: Denied dermatology symptoms, rash and scar.  Neurologic: Denied neurology symptoms, dizziness, headache, neck pain and syncope.  Psychiatric: Denied psychiatric symptoms, anxiety and depression.  Endocrine: Denied endocrine symptoms including hot flashes and night sweats.   Meds:   Current Outpatient Medications on File Prior to Visit  Medication Sig Dispense Refill   Ibuprofen 200 MG CAPS Take 200-400 mg by mouth daily as needed (pain).     methylPREDNISolone (MEDROL DOSEPAK) 4 MG TBPK tablet Take 6 pills on day one then decrease by 1 pill each day (Patient not taking: No sig reported) 21 tablet 0   PRESCRIPTION MEDICATION Inject 1 Dose as directed  every 4 (four) months. Hormone replacement pellet administered at Dr's offices     valACYclovir (VALTREX) 1000 MG tablet Take 1 tablet (1,000 mg total) by mouth 3 (three) times  daily. 21 tablet 0   No current facility-administered medications on file prior to visit.       Objective:     There were no vitals filed for this visit.  Filed Weights   06/20/21 0908  Weight: 183 lb 6.4 oz (83.2 kg)              Physical examination General NAD, Conversant  HEENT Atraumatic; Op clear with mmm.  Normo-cephalic. Pupils reactive. Anicteric sclerae  Thyroid/Neck Smooth without nodularity or enlargement. Normal ROM.  Neck Supple.  Skin No rashes, lesions or ulceration. Normal palpated skin turgor. No nodularity.  Breasts: No masses or discharge.  Symmetric.  No axillary adenopathy.  Lungs: Clear to auscultation.No rales or wheezes. Normal Respiratory effort, no retractions.  Heart: NSR.  No murmurs or rubs appreciated. No periferal edema  Abdomen: Soft.  Non-tender.  No masses.  No HSM. No hernia  Extremities: Moves all appropriately.  Normal ROM for age. No lymphadenopathy.  Neuro: Oriented to PPT.  Normal mood. Normal affect.     Pelvic:   Vulva: Normal appearance.  No lesions.  Vagina: No lesions or abnormalities noted.  Support: Normal pelvic support.  Urethra No masses tenderness or scarring.  Meatus Normal size without lesions or prolapse.  Cervix: Normal appearance.  No lesions.  Anus: Normal exam.  No lesions.  Perineum: Normal exam.  No lesions.        Bimanual   Uterus: Normal size.  Non-tender.  Mobile.  AV.  Adnexae: No masses.  Non-tender to palpation.  Cul-de-sac: Negative for abnormality.     Assessment:    [id number] Patient Active Problem List   Diagnosis Date Noted   Lipoma of left shoulder    Lipoma of anterior chest wall 04/03/2017   Cyst of left clavicle 03/20/2017     1. Well woman exam with routine gynecological exam   2. Encounter to establish care with new doctor   3. Pap smear for cervical cancer screening   4. UTI symptoms   5. Vaginal itching        Plan:            1.  Basic Screening Recommendations The basic  screening recommendations for asymptomatic women were discussed with the patient during her visit.  The age-appropriate recommendations were discussed with her and the rational for the tests reviewed.  When I am informed by the patient that another primary care physician has previously obtained the age-appropriate tests and they are up-to-date, only outstanding tests are ordered and referrals given as necessary.  Abnormal results of tests will be discussed with her when all of her results are completed.  Routine preventative health maintenance measures emphasized: Exercise/Diet/Weight control, Tobacco Warnings, Alcohol/Substance use risks and Stress Management Pap performed-patient up-to-date with mammography -will return for fasting blood work. 2.  Nuswab performed 3.  Patient will inform us if her pelvic pain returns.  Consider ultrasound follow-up if necessary.  Orders Orders Placed This Encounter  Procedures   NuSwab Vaginitis Plus (VG+)   Basic metabolic panel   CBC   Cologuard   Glucose, random   Hemoglobin A1c   Lipid panel   TSH   POCT urinalysis dipstick    No orders of the defined types were placed in this encounter.  F/U  Return in about 1 year (around 06/20/2022) for Annual Physical, We will contact her with any abnormal test results. I spent 12 additional minutes involved in the care of this patient especially in regard to her pelvic pain and vaginitis type symptoms.  Additional time was spent preparing to see the patient by obtaining and reviewing her medical history (including labs, imaging tests and prior procedures), documenting clinical information in the electronic health record (EHR), counseling and coordinating care plans, writing and sending prescriptions, ordering tests or procedures and in direct communicating with the patient and medical staff discussing pertinent items from her history and physical exam.  Finis Bud, M.D. 06/20/2021 10:03 AM

## 2021-06-20 NOTE — Progress Notes (Signed)
Pt present to est care. Pt reported having yeast infections issues along with other types of vaginal issues. UA completed and documented. Pap order placed.

## 2021-06-21 LAB — CYTOLOGY - PAP
Comment: NEGATIVE
Diagnosis: NEGATIVE
High risk HPV: NEGATIVE

## 2021-06-22 LAB — NUSWAB VAGINITIS PLUS (VG+)
Atopobium vaginae: HIGH Score — AB
BVAB 2: HIGH Score — AB
Candida albicans, NAA: NEGATIVE
Candida glabrata, NAA: NEGATIVE
Chlamydia trachomatis, NAA: NEGATIVE
Megasphaera 1: HIGH Score — AB
Neisseria gonorrhoeae, NAA: NEGATIVE
Trich vag by NAA: NEGATIVE

## 2021-06-22 MED ORDER — METRONIDAZOLE 500 MG PO TABS: 500.0000 mg | ORAL_TABLET | Freq: Two times a day (BID) | ORAL | 0 refills | Status: DC

## 2021-06-22 NOTE — Progress Notes (Signed)
Her results are c/w BV - Needs Rx for: Flagyl 500mg  - 1 PO BID X7 days

## 2021-06-22 NOTE — Addendum Note (Signed)
Addended by: Chilton Greathouse on: 06/22/2021 04:11 PM   Modules accepted: Orders

## 2021-07-05 ENCOUNTER — Other Ambulatory Visit: Payer: Self-pay

## 2021-07-05 ENCOUNTER — Other Ambulatory Visit: Payer: BC Managed Care – PPO

## 2021-07-05 DIAGNOSIS — Z01419 Encounter for gynecological examination (general) (routine) without abnormal findings: Secondary | ICD-10-CM | POA: Diagnosis not present

## 2021-07-06 LAB — TSH: TSH: 3.61 u[IU]/mL (ref 0.450–4.500)

## 2021-07-06 LAB — HEMOGLOBIN A1C
Est. average glucose Bld gHb Est-mCnc: 97 mg/dL
Hgb A1c MFr Bld: 5 % (ref 4.8–5.6)

## 2021-07-06 LAB — LIPID PANEL
Chol/HDL Ratio: 4.3 ratio (ref 0.0–4.4)
Cholesterol, Total: 170 mg/dL (ref 100–199)
HDL: 40 mg/dL (ref >39)
LDL Chol Calc (NIH): 112 mg/dL — ABNORMAL HIGH (ref 0–99)
Triglycerides: 100 mg/dL (ref 0–149)
VLDL Cholesterol Cal: 18 mg/dL (ref 5–40)

## 2021-07-06 LAB — CBC
Hematocrit: 41.9 % (ref 34.0–46.6)
Hemoglobin: 14.5 g/dL (ref 11.1–15.9)
MCH: 31.2 pg (ref 26.6–33.0)
MCHC: 34.6 g/dL (ref 31.5–35.7)
MCV: 90 fL (ref 79–97)
Platelets: 214 10*3/uL (ref 150–450)
RBC: 4.65 x10E6/uL (ref 3.77–5.28)
RDW: 12.6 % (ref 11.7–15.4)
WBC: 4.7 10*3/uL (ref 3.4–10.8)

## 2021-07-06 LAB — BASIC METABOLIC PANEL
BUN/Creatinine Ratio: 16 (ref 9–23)
BUN: 13 mg/dL (ref 6–24)
CO2: 25 mmol/L (ref 20–29)
Calcium: 9.8 mg/dL (ref 8.7–10.2)
Chloride: 103 mmol/L (ref 96–106)
Creatinine, Ser: 0.8 mg/dL (ref 0.57–1.00)
Glucose: 80 mg/dL (ref 70–99)
Potassium: 4.8 mmol/L (ref 3.5–5.2)
Sodium: 138 mmol/L (ref 134–144)
eGFR: 91 mL/min/{1.73_m2} (ref >59)

## 2021-07-11 ENCOUNTER — Telehealth: Payer: Self-pay | Admitting: Obstetrics and Gynecology

## 2021-07-11 NOTE — Telephone Encounter (Signed)
Pt called stating that she had seen Dr.Evans end of October  he had mentioned if symptoms worsened may need an [place], pt states that her symptoms are getting worse. She is having dull pain shooting to lower back and is concerned about ovarian cysts. Pt has requested to see Dr.Cherry as she is use to female providers. Would you liek pt to be scheduled an ultrasound, her apt with you is 12-7

## 2021-08-01 ENCOUNTER — Encounter: Payer: Self-pay | Admitting: Obstetrics and Gynecology

## 2021-08-01 ENCOUNTER — Ambulatory Visit (INDEPENDENT_AMBULATORY_CARE_PROVIDER_SITE_OTHER): Payer: BC Managed Care – PPO | Admitting: Obstetrics and Gynecology

## 2021-08-01 ENCOUNTER — Other Ambulatory Visit: Payer: Self-pay

## 2021-08-01 VITALS — BP 129/86 | HR 84 | Ht 62.0 in | Wt 181.1 lb

## 2021-08-01 DIAGNOSIS — R102 Pelvic and perineal pain: Secondary | ICD-10-CM

## 2021-08-01 NOTE — Progress Notes (Signed)
    GYNECOLOGY PROGRESS NOTE  Subjective:    Patient ID: Sharon Hickman, female    DOB: 1971-06-15, 50 y.o.   MRN: [id number]  HPI  Patient is a 50 y.o. [id number] female who presents for complaints of back pain, also a pinching sensation in her back and vaginal area, along with intermittent foot numbness. She reports that all of this began starting in October, where she had the sudden onset of pelvic pain while walking around at a fair, felt like something burst.  Wondered if it could have been a cyst. Was seen by Dr. Amalia Hailey of [facility] for her annual exam and mentioned her symptoms at that time. States that she was told that her symptoms would likely resolve over time if it truly was a cyst. Is following up because her symptoms have persisted.  It even wakes he up sometimes out of her sleep.    The following portions of the patient's history were reviewed and updated as appropriate: allergies, current medications, past family history, past medical history, past social history, past surgical history, and problem list.  Review of Systems Pertinent items noted in HPI and remainder of comprehensive ROS otherwise negative.   Objective:  Blood pressure 129/86, pulse 84, height 5\' 2"  (1.575 m), weight 181 lb 1.6 oz (82.1 kg), last menstrual period 07/31/2021.  Body mass index is 33.12 kg/m. General appearance: alert and no distress Remainder of exam deferred.    Assessment:   1. Pelvic pain     Plan:   1. Pelvic pain - Patient believes she may possibly have had a cyst. However still having residual symptoms for several months after. Discussed other possible etiologies. Will order pelvic ultrasound for persistent pain. Can utilize OTC pain relievers as needed.     A total of 20 minutes were spent face-to-face with the patient during this encounter and over half of that time involved counseling and coordination of care.   Rubie Maid, MD [facility]

## 2021-08-01 NOTE — Patient Instructions (Signed)
Pelvic Pain, Female Pelvic pain is pain in your lower abdomen, below your belly button and between your hips. The pain may start suddenly (be acute), keep coming back (be recurring), or last a long time (become chronic). Pelvic pain that lasts longer than 6 months is considered chronic. Pelvic pain may affect your: Reproductive organs. Urinary system. Digestive tract. Musculoskeletal system. There are many potential causes of pelvic pain. Sometimes, the pain can be a result of digestive or urinary conditions, strained muscles or ligaments, or reproductive conditions. Sometimes the cause of pelvic pain is not known. Follow these instructions at home:  Take over-the-counter and prescription medicines only as told by your health care provider. Rest as told by your health care provider. Do not have sex if it hurts. Keep a journal of your pelvic pain. Write down: When the pain started. Where the pain is located. What seems to make the pain better or worse, such as food or your monthly period (menstrual cycle). Any symptoms you have along with the pain. Keep all follow-up visits. This is important. Contact a health care provider if: Medicine does not help your pain, or your pain comes back. You have new symptoms. You have abnormal vaginal discharge or bleeding, including bleeding after menopause. You have a fever or chills. You are constipated. You have blood in your urine or stool (feces). You have foul-smelling urine. You feel weak or light-headed. Get help right away if: You have sudden severe pain. Your pain gets steadily worse. You have severe pain along with fever, nausea, vomiting, or excessive sweating. You lose consciousness. These symptoms may represent a serious problem that is an emergency. Do not wait to see if the symptoms will go away. Get medical help right away. Call your local emergency services (911 in the [place] Do not drive yourself to the hospital. Summary Pelvic  pain is pain in your lower abdomen, below your belly button and between your hips. There are many potential causes of pelvic pain. Keep a journal of your pelvic pain. This information is not intended to replace advice given to you by your health care provider. Make sure you discuss any questions you have with your health care provider. Document Revised: 12/19/2020 Document Reviewed: 12/19/2020 Elsevier Patient Education  [place].

## 2021-08-13 ENCOUNTER — Other Ambulatory Visit: Payer: Self-pay

## 2021-08-13 ENCOUNTER — Ambulatory Visit
Admission: RE | Admit: 2021-08-13 | Discharge: 2021-08-13 | Disposition: A | Discharge status: Home / Self Care | Payer: BC Managed Care – PPO | Source: Ambulatory Visit | Attending: Obstetrics and Gynecology | Admitting: Obstetrics and Gynecology

## 2021-08-13 DIAGNOSIS — R102 Pelvic and perineal pain: Secondary | ICD-10-CM | POA: Diagnosis not present

## 2021-08-13 DIAGNOSIS — D259 Leiomyoma of uterus, unspecified: Secondary | ICD-10-CM | POA: Diagnosis not present

## 2021-08-16 DIAGNOSIS — R102 Pelvic and perineal pain: Secondary | ICD-10-CM | POA: Diagnosis not present

## 2021-08-16 DIAGNOSIS — Z6831 Body mass index (BMI) 31.0-31.9, adult: Secondary | ICD-10-CM | POA: Diagnosis not present

## 2021-08-17 ENCOUNTER — Telehealth: Payer: Self-pay

## 2021-08-17 NOTE — Telephone Encounter (Signed)
[facility] referring for Body Mass, Female pelvic pain . Paper records in media. Called and left voicemail for patient to call back to be scheduled.

## 2021-08-21 NOTE — Telephone Encounter (Signed)
Patient is scheduled for 08/29/20 with [facility]

## 2021-08-28 NOTE — Progress Notes (Deleted)
° ° °  Lynnell Jude, MD   No chief complaint on file.   HPI:      [name] is a 51 y.o. [id number] whose LMP was Patient's last menstrual period was 07/31/2021 (exact date)., presents today for NP eval of pelvic pain    Patient Active Problem List   Diagnosis Date Noted   Lipoma of left shoulder    Lipoma of anterior chest wall 04/03/2017   Cyst of left clavicle 03/20/2017    Past Surgical History:  Procedure Laterality Date   CESAREAN SECTION     X2   LIPOMA EXCISION Left 04/17/2017   Procedure: EXCISION LIPOMA-LEFT CLAVICLE;  Surgeon: Clayburn Pert, MD;  Location: [facility] ORS;  Service: General;  Laterality: Left;   TUBAL LIGATION      Family History  Problem Relation Age of Onset   Cancer Mother 7       SKIN CANCER   Migraines Mother    Thyroid disease Mother    Diabetes Father    Heart failure Father    Hyperlipidemia Father    Hypertension Father    Breast cancer Maternal Aunt 11   Breast cancer Cousin 22   Breast cancer Sister 79   Breast cancer Maternal Aunt 43    Social History   Socioeconomic History   Marital status: Married    Spouse name: Not on file   Number of children: Not on file   Years of education: Not on file   Highest education level: Not on file  Occupational History   Not on file  Tobacco Use   Smoking status: Former    Types: Cigarettes   Smokeless tobacco: Never   Tobacco comments:    when stressed  Vaping Use   Vaping Use: Never used  Substance and Sexual Activity   Alcohol use: Yes    Alcohol/week: 1.0 standard drink    Types: 1 Glasses of wine per week    Comment: rarely-2 X PER YEAR   Drug use: No   Sexual activity: Yes    Partners: Male    Birth control/protection: None    Comment: BTL  Other Topics Concern   Not on file  Social History Narrative   Not on file   Social Determinants of Health   Financial Resource Strain: Not on file  Food Insecurity: Not on file   Transportation Needs: Not on file  Physical Activity: Not on file  Stress: Not on file  Social Connections: Not on file  Intimate Partner Violence: Not on file    No outpatient medications prior to visit.   No facility-administered medications prior to visit.      ROS:  Review of Systems BREAST: No symptoms   OBJECTIVE:   Vitals:  LMP 07/31/2021 (Exact Date)   Physical Exam  Results: No results found for this or any previous visit (from the past 24 hour(s)).   Assessment/Plan: No diagnosis found.    No orders of the defined types were placed in this encounter.     No follow-ups on file.  Shiesha Jahn B. Nabeeha Badertscher, PA-C 08/29/2021 9:22 AM

## 2021-08-29 ENCOUNTER — Ambulatory Visit (INDEPENDENT_AMBULATORY_CARE_PROVIDER_SITE_OTHER): Payer: BC Managed Care – PPO | Admitting: Obstetrics and Gynecology

## 2021-08-29 ENCOUNTER — Other Ambulatory Visit: Payer: Self-pay

## 2021-08-29 ENCOUNTER — Encounter: Payer: Self-pay | Admitting: Obstetrics and Gynecology

## 2021-08-29 VITALS — BP 120/88

## 2021-08-29 DIAGNOSIS — R102 Pelvic and perineal pain: Secondary | ICD-10-CM | POA: Diagnosis not present

## 2021-08-29 NOTE — Patient Instructions (Signed)
I value your feedback and you entrusting us with your care. If you get a [facility] patient survey, I would appreciate you taking the time to let us know about your experience today. Thank you! ? ? ?

## 2021-08-29 NOTE — Progress Notes (Signed)
Sharon Jude, MD   Chief Complaint  Patient presents with   Pelvic Pain    HPI:      Sharon Hickman is a 51 y.o. [id number] whose LMP was Patient's last menstrual period was 08/24/2021 (exact date)., presents today for NP eval of pelvic pain since 10/22, referred by PCP. Pt felt a "pop" in suprapubic area/[place]. Pt noted sharp pain initially that then became more throbbing and lasted 2 days. Then had some light BTB for a day or so. Pt saw Dr. Amalia Hailey at [facility] and thought it could be ruptured ovar cyst, and that sx should improve. Pain was then intermittent in Nov, described as pinching or throbbing pain, lasting several hours, alternating between RLQ and LLQ. Sx worse when on feet but better if sitting for job. Tried ibup with minimal relief. Went back to [facility] and had GYN u/s that showed 19 mm small calcified leio but  negative otherwise. Dr. Marcelline Mates didn't feel that was cause of pt's sx.  Since Nov, pain sx of pinching and throbbing are more frequent daily, and increasing in intensity, still alternating RLQ and LLQ. Sx still worse with activity/being on feet, as well as when lying in bed. Pain now radiates to low back and pt has noted some tingling in back of legs and numbness in her toes when pain is present.  Pt denies any urin, vag, or GI sx. No fevers. No regular lifting, no hx of ovar cysts.  Menses are monthly, lasting 5-7 days, mod flow, no BTB, mild dysmen. LMP was a little heavier but still mod flow, with small clots and a little worse dysmen.  She is sex active, having dyspareunia since pain started, no bleeding. GYN u/s caused pain on RLQ.   Neg pap/neg HPV DNA 10/22  Patient Active Problem List   Diagnosis Date Noted   Lipoma of left shoulder    Lipoma of anterior chest wall 04/03/2017   Cyst of left clavicle 03/20/2017    Past Surgical History:  Procedure Laterality Date   CESAREAN SECTION     X2   LIPOMA EXCISION Left 04/17/2017   Procedure:  EXCISION LIPOMA-LEFT CLAVICLE;  Surgeon: Clayburn Pert, MD;  Location: [facility] OR[facility];  Service: General;  Laterality: Left;   TUBAL LIGATION      Family History  Problem Relation Age of Onset   Cancer Mother 63       SKIN CANCER   Migraines Mother    Thyroid disease Mother    Diabetes Father    Heart failure Father    Hyperlipidemia Father    Hypertension Father    Breast cancer Maternal Aunt 66   Breast cancer Cousin 63   Breast cancer Sister 32   Breast cancer Maternal Aunt 76    Social History   Socioeconomic History   Marital status: Married    Spouse name: Not on file   Number of children: Not on file   Years of education: Not on file   Highest education level: Not on file  Occupational History   Not on file  Tobacco Use   Smoking status: Former    Types: Cigarettes   Smokeless tobacco: Never   Tobacco comments:    when stressed  Vaping Use   Vaping Use: Never used  Substance and Sexual Activity   Alcohol use: Yes    Alcohol/week: 1.0 standard drink    Types: 1 Glasses of wine per week  Comment: rarely-2 X PER YEAR   Drug use: No   Sexual activity: Yes    Partners: Male    Birth control/protection: None    Comment: BTL  Other Topics Concern   Not on file  Social History Narrative   Not on file   Social Determinants of Health   Financial Resource Strain: Not on file  Food Insecurity: Not on file  Transportation Needs: Not on file  Physical Activity: Not on file  Stress: Not on file  Social Connections: Not on file  Intimate Partner Violence: Not on file    Outpatient Medications Prior to Visit  Medication Sig Dispense Refill   IBUPROFEN PO Take by mouth.     No facility-administered medications prior to visit.      ROS:  Review of Systems  Constitutional:  Negative for fever.  Gastrointestinal:  Negative for blood in stool, constipation, diarrhea, nausea and vomiting.  Genitourinary:  Positive for dyspareunia and pelvic pain. Negative  for dysuria, flank pain, frequency, hematuria, urgency, vaginal bleeding, vaginal discharge and vaginal pain.  Musculoskeletal:  Negative for back pain.  Skin:  Negative for rash.  Neurological:  Positive for numbness and headaches.  BREAST: No symptoms   OBJECTIVE:   Vitals:  BP 120/88 (BP Location: Left Arm, Patient Position: Sitting, Cuff Size: Normal)    LMP 08/24/2021 (Exact Date)   Physical Exam Vitals reviewed.  Constitutional:      Appearance: She is well-developed.  Pulmonary:     Effort: Pulmonary effort is normal.  Abdominal:     Palpations: Abdomen is soft.     Tenderness: There is abdominal tenderness in the right lower quadrant. There is no guarding or rebound.  Genitourinary:    General: Normal vulva.     Pubic Area: No rash.      Labia:        Right: No rash, tenderness or lesion.        Left: No rash, tenderness or lesion.      Vagina: Normal. No vaginal discharge, erythema or tenderness.     Cervix: Normal.     Uterus: Normal. Not enlarged and not tender.      Adnexa: Left adnexa normal.       Right: Tenderness present. No mass.         Left: No mass or tenderness.    Musculoskeletal:        General: Normal range of motion.     Cervical back: Normal range of motion.  Skin:    General: Skin is warm and dry.  Neurological:     General: No focal deficit present.     Mental Status: She is alert and oriented to person, place, and time.  Psychiatric:        Mood and Affect: Mood normal.        Behavior: Behavior normal.        Thought Content: Thought content normal.        Judgment: Judgment normal.    Assessment/Plan: Pelvic pain--since 10/22, sx worsening. Had neg GYN u/s 12/22. Given sx with increased activity and being on her feet, as well as now LBP and leg/toe tingling/numbness, sx most likely MSK (no evid of Gyn etiology). Recommended ibup 800 mg TID, heating pad, pelvic stretches, and seeing chiro to make sure hips adjusted. Pt may also get xray  with chiro. If sx persist, will then check imaging and refer to pelvic PT.     Return if symptoms worsen or  fail to improve.  Octivia Canion B. Davan Hark, PA-C 08/29/2021 1:20 PM

## 2021-09-24 ENCOUNTER — Ambulatory Visit: Payer: BC Managed Care – PPO | Admitting: Dermatology

## 2021-11-09 DIAGNOSIS — Z713 Dietary counseling and surveillance: Secondary | ICD-10-CM | POA: Diagnosis not present

## 2021-11-09 DIAGNOSIS — Z7182 Exercise counseling: Secondary | ICD-10-CM | POA: Diagnosis not present

## 2021-11-09 DIAGNOSIS — E785 Hyperlipidemia, unspecified: Secondary | ICD-10-CM | POA: Diagnosis not present

## 2021-11-09 DIAGNOSIS — D519 Vitamin B12 deficiency anemia, unspecified: Secondary | ICD-10-CM | POA: Diagnosis not present

## 2021-11-09 DIAGNOSIS — E559 Vitamin D deficiency, unspecified: Secondary | ICD-10-CM | POA: Diagnosis not present

## 2021-11-09 DIAGNOSIS — E119 Type 2 diabetes mellitus without complications: Secondary | ICD-10-CM | POA: Diagnosis not present

## 2021-11-09 DIAGNOSIS — E039 Hypothyroidism, unspecified: Secondary | ICD-10-CM | POA: Diagnosis not present

## 2021-11-09 DIAGNOSIS — D509 Iron deficiency anemia, unspecified: Secondary | ICD-10-CM | POA: Diagnosis not present

## 2021-11-09 DIAGNOSIS — E282 Polycystic ovarian syndrome: Secondary | ICD-10-CM | POA: Diagnosis not present

## 2021-11-09 DIAGNOSIS — Z6832 Body mass index (BMI) 32.0-32.9, adult: Secondary | ICD-10-CM | POA: Diagnosis not present

## 2022-07-09 ENCOUNTER — Encounter: Payer: BC Managed Care – PPO | Admitting: Obstetrics and Gynecology

## 2022-07-09 DIAGNOSIS — Z01419 Encounter for gynecological examination (general) (routine) without abnormal findings: Secondary | ICD-10-CM

## 2022-07-09 DIAGNOSIS — Z1231 Encounter for screening mammogram for malignant neoplasm of breast: Secondary | ICD-10-CM

## 2023-11-19 IMAGING — US US PELVIS COMPLETE WITH TRANSVAGINAL
1 series · 13 of 25 positions shown · non-contrast
Comparison: 10/03/2016

CLINICAL DATA: Diffuse pelvic pain for 2 months, LMP 07/31/2021,
history of Caesarean section x2



[Series 1: us pelvis complete with transvaginal · 0.28mm/px · 13 of 120 slices shown]
[im 1/120]
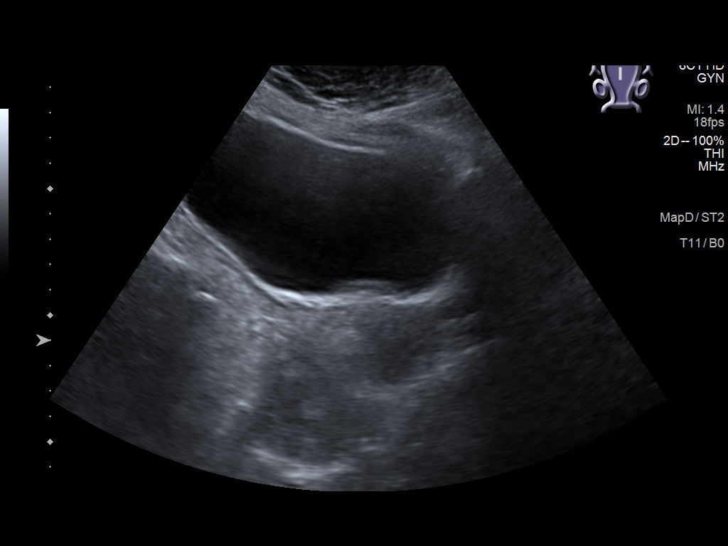
[im 10/120]
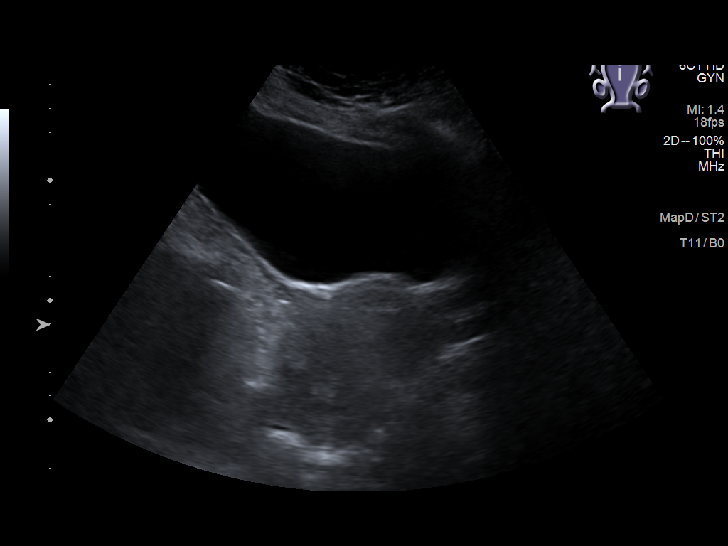
[im 20/120]
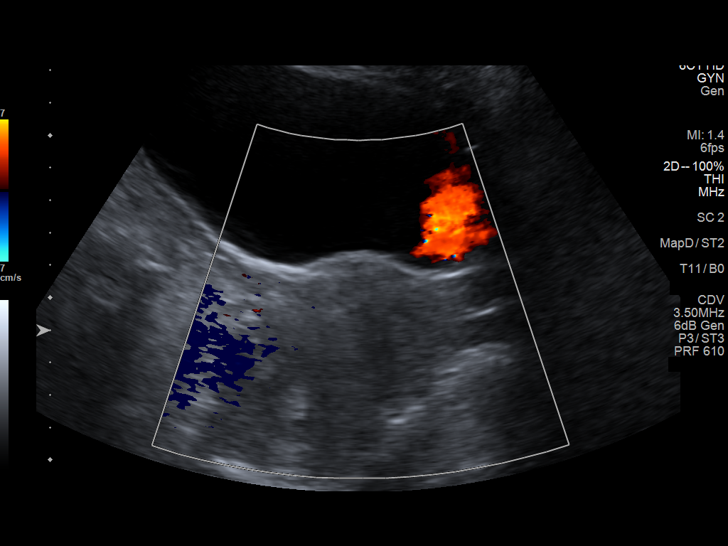
[im 30/120]
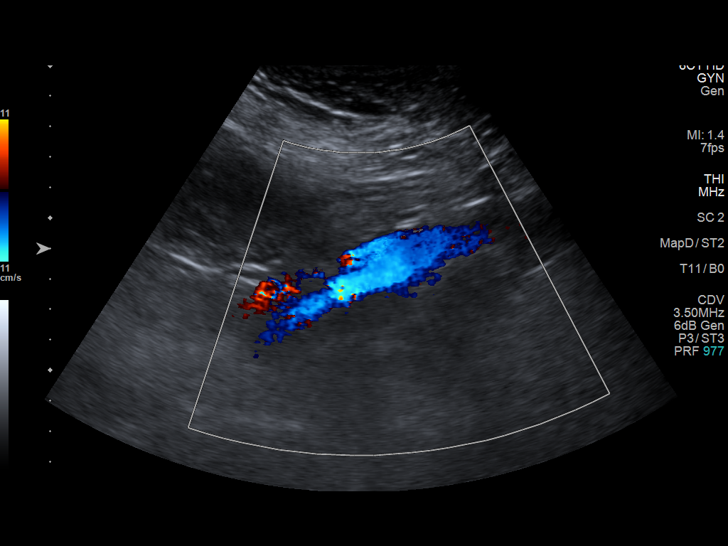
[im 40/120]
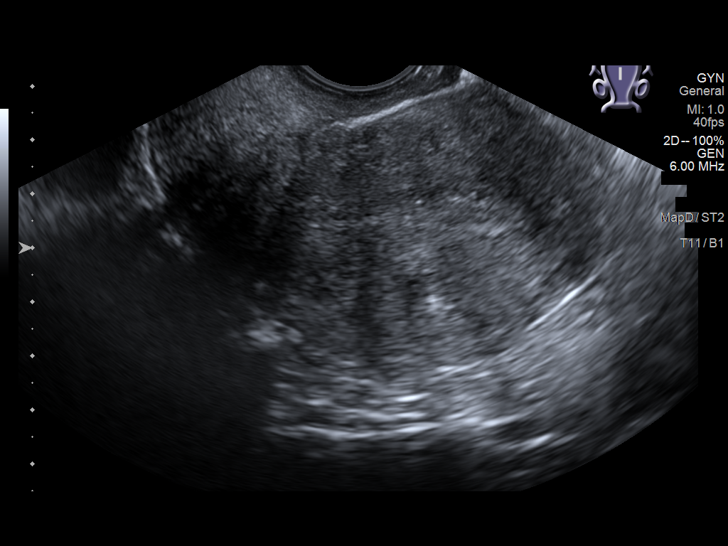
[im 50/120]
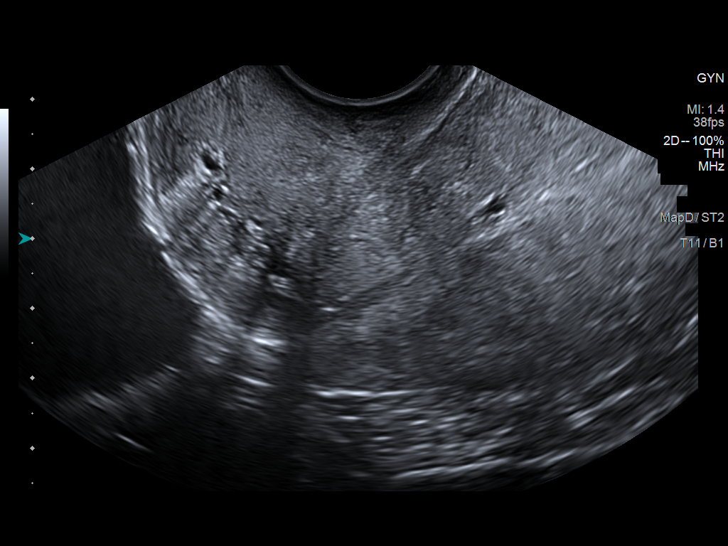
[im 60/120]
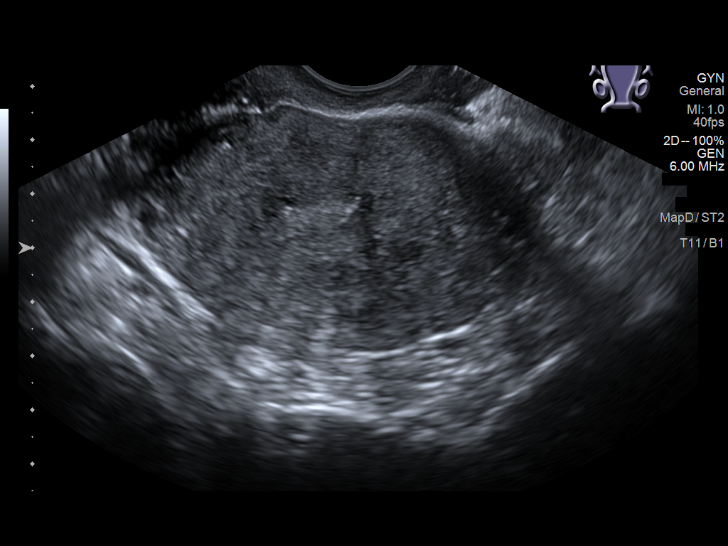
[im 70/120]
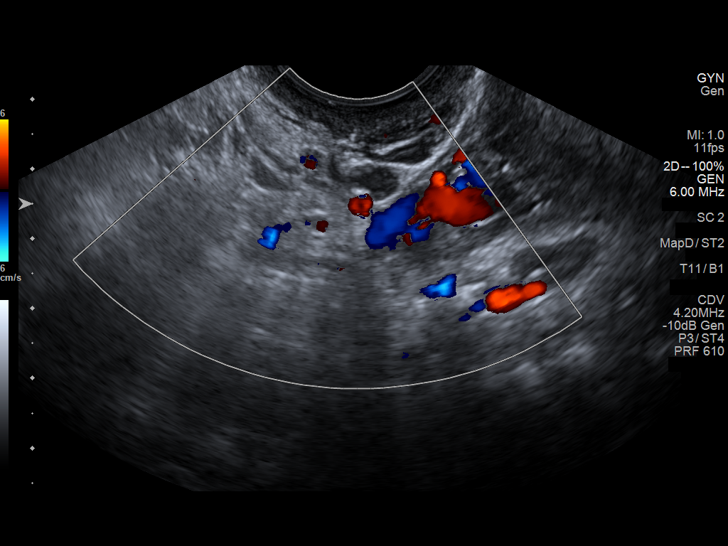
[im 80/120]
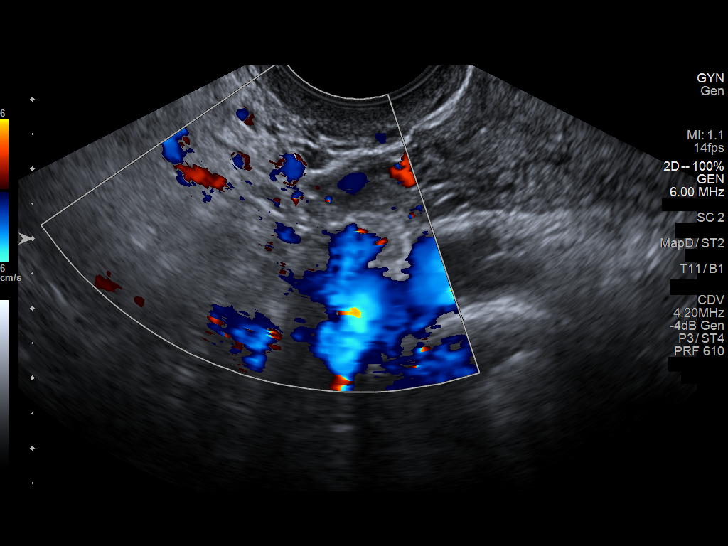
[im 90/120]
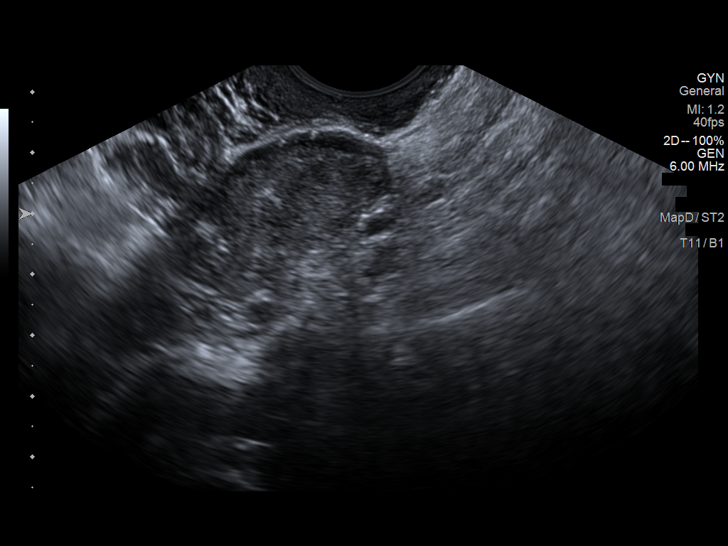
[im 100/120]
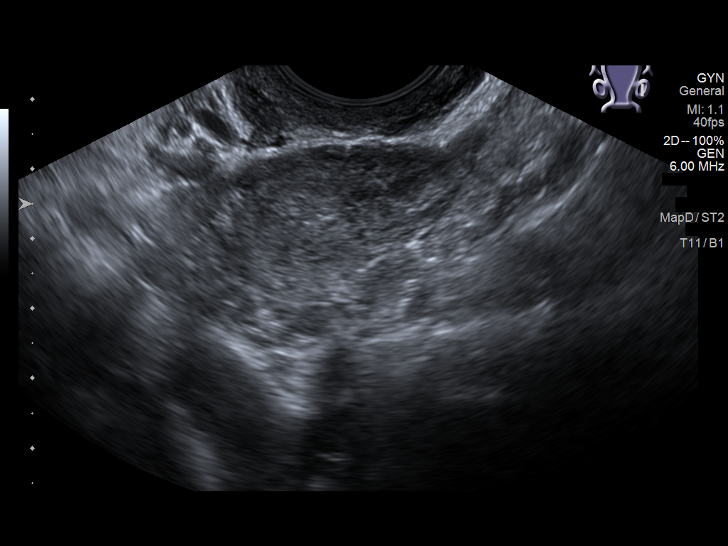
[im 110/120]
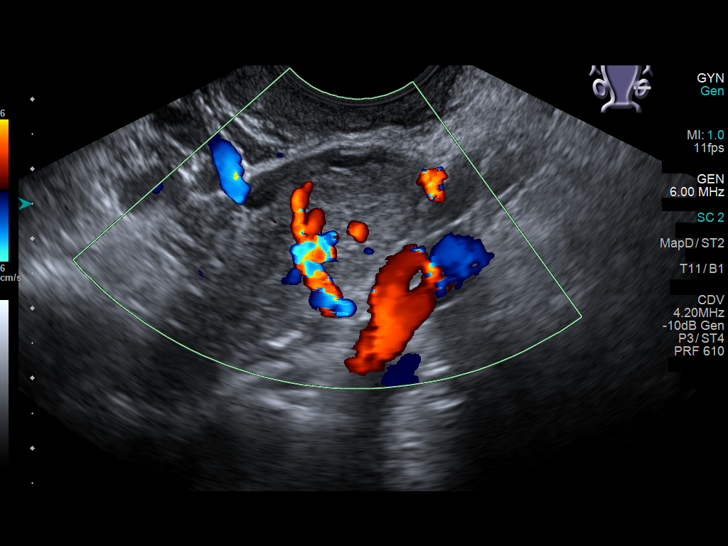
[im 120/120]
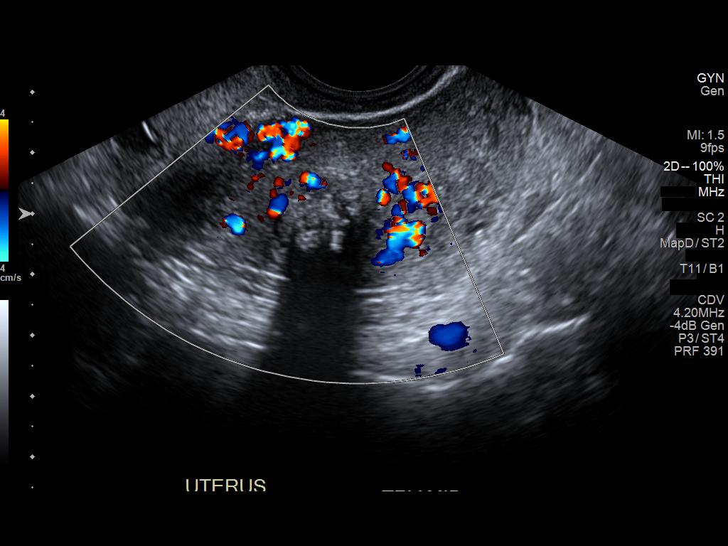

[13 of 25 positions shown; findings below may reference images not displayed]

FINDINGS: Uterus

Measurements: 8.3 x 5.1 x 6.6 cm = volume: 144 mL. Retroverted.
Heterogeneous myometrium. Small calcified leiomyoma LEFT lateral mid
uterus 19 x 15 x 13 mm. No additional masses.

Endometrium

Thickness: 8 mm. No endometrial fluid or mass. Few nonspecific
microcalcifications.

Right ovary

Measurements: 2.6 x 1.8 x 2.1 cm = volume: 5.0 mL. Normal morphology
without mass

Left ovary

Measurements: 3.0 x 2.6 x 3.0 cm = volume: 12.5 mL. Normal
morphology without mass

Other findings

No free pelvic fluid.  No adnexal masses.
IMPRESSION: Small calcified leiomyoma LEFT lateral mid uterus measuring 19 mm
greatest size.

Otherwise negative exam.

## 2024-01-07 ENCOUNTER — Other Ambulatory Visit: Payer: Self-pay

## 2024-09-24 ENCOUNTER — Other Ambulatory Visit: Payer: Self-pay | Admitting: Diagnostic Radiology

## 2024-09-24 DIAGNOSIS — R928 Other abnormal and inconclusive findings on diagnostic imaging of breast: Secondary | ICD-10-CM

## 2024-09-30 ENCOUNTER — Inpatient Hospital Stay
Admission: RE | Admit: 2024-09-30 | Discharge: 2024-09-30 | Attending: Diagnostic Radiology | Admitting: Diagnostic Radiology

## 2024-09-30 DIAGNOSIS — R928 Other abnormal and inconclusive findings on diagnostic imaging of breast: Secondary | ICD-10-CM

## 2024-09-30 MED ORDER — GADOPICLENOL 0.5 MMOL/ML IV SOLN
9.0000 mL | Freq: Once | INTRAVENOUS | Status: AC | PRN
  Administered 2024-09-30: 9 mL via INTRAVENOUS
